# Patient Record
Sex: Female | Born: 1945
Health system: Southern US, Community
[De-identification: ages and names within clinical notes are randomized; demographics above are authoritative.]

## PROBLEM LIST (undated history)

## (undated) DIAGNOSIS — K219 Gastro-esophageal reflux disease without esophagitis: Secondary | ICD-10-CM

## (undated) DIAGNOSIS — M810 Age-related osteoporosis without current pathological fracture: Secondary | ICD-10-CM

## (undated) DIAGNOSIS — K224 Dyskinesia of esophagus: Secondary | ICD-10-CM

## (undated) DIAGNOSIS — F419 Anxiety disorder, unspecified: Secondary | ICD-10-CM

## (undated) DIAGNOSIS — K2970 Gastritis, unspecified, without bleeding: Secondary | ICD-10-CM

## (undated) DIAGNOSIS — T7840XA Allergy, unspecified, initial encounter: Secondary | ICD-10-CM

## (undated) DIAGNOSIS — B9681 Helicobacter pylori [H. pylori] as the cause of diseases classified elsewhere: Secondary | ICD-10-CM

## (undated) DIAGNOSIS — H269 Unspecified cataract: Secondary | ICD-10-CM

## (undated) DIAGNOSIS — L509 Urticaria, unspecified: Secondary | ICD-10-CM

## (undated) DIAGNOSIS — K5790 Diverticulosis of intestine, part unspecified, without perforation or abscess without bleeding: Secondary | ICD-10-CM

## (undated) HISTORY — DX: Diverticulosis of intestine, part unspecified, without perforation or abscess without bleeding: K57.90

## (undated) HISTORY — DX: Urticaria, unspecified: L50.9

## (undated) HISTORY — DX: Age-related osteoporosis without current pathological fracture: M81.0

## (undated) HISTORY — DX: Gastritis, unspecified, without bleeding: K29.70

## (undated) HISTORY — DX: Dyskinesia of esophagus: K22.4

## (undated) HISTORY — PX: COLONOSCOPY: SHX174

## (undated) HISTORY — DX: Unspecified cataract: H26.9

## (undated) HISTORY — DX: Gastro-esophageal reflux disease without esophagitis: K21.9

## (undated) HISTORY — PX: SIGMOIDOSCOPY: SUR1295

## (undated) HISTORY — DX: Anxiety disorder, unspecified: F41.9

## (undated) HISTORY — PX: UPPER GASTROINTESTINAL ENDOSCOPY: SHX188

## (undated) HISTORY — PX: ESOPHAGOGASTRODUODENOSCOPY: SHX1529

## (undated) HISTORY — DX: Helicobacter pylori (H. pylori) as the cause of diseases classified elsewhere: B96.81

## (undated) HISTORY — DX: Allergy, unspecified, initial encounter: T78.40XA

---

## 1998-03-11 ENCOUNTER — Other Ambulatory Visit: Admission: RE | Admit: 1998-03-11 | Discharge: 1998-03-11 | Payer: Self-pay | Admitting: Obstetrics & Gynecology

## 1999-03-24 ENCOUNTER — Other Ambulatory Visit: Admission: RE | Admit: 1999-03-24 | Discharge: 1999-03-24 | Payer: Self-pay | Admitting: Obstetrics & Gynecology

## 2000-05-13 ENCOUNTER — Other Ambulatory Visit: Admission: RE | Admit: 2000-05-13 | Discharge: 2000-05-13 | Payer: Self-pay | Admitting: Family Medicine

## 2000-09-10 ENCOUNTER — Other Ambulatory Visit: Admission: RE | Admit: 2000-09-10 | Discharge: 2000-09-10 | Payer: Self-pay | Admitting: Obstetrics & Gynecology

## 2000-11-15 ENCOUNTER — Other Ambulatory Visit: Admission: RE | Admit: 2000-11-15 | Discharge: 2000-11-15 | Payer: Self-pay | Admitting: Obstetrics & Gynecology

## 2001-05-15 ENCOUNTER — Other Ambulatory Visit: Admission: RE | Admit: 2001-05-15 | Discharge: 2001-05-15 | Payer: Self-pay | Admitting: Obstetrics & Gynecology

## 2001-12-09 ENCOUNTER — Other Ambulatory Visit: Admission: RE | Admit: 2001-12-09 | Discharge: 2001-12-09 | Payer: Self-pay | Admitting: Obstetrics & Gynecology

## 2002-12-28 ENCOUNTER — Other Ambulatory Visit: Admission: RE | Admit: 2002-12-28 | Discharge: 2002-12-28 | Payer: Self-pay | Admitting: Obstetrics & Gynecology

## 2003-09-27 ENCOUNTER — Encounter: Admission: RE | Admit: 2003-09-27 | Discharge: 2003-09-27 | Payer: Self-pay | Admitting: Internal Medicine

## 2003-12-14 ENCOUNTER — Ambulatory Visit: Payer: Self-pay | Admitting: Internal Medicine

## 2004-01-24 ENCOUNTER — Other Ambulatory Visit: Admission: RE | Admit: 2004-01-24 | Discharge: 2004-01-24 | Payer: Self-pay | Admitting: Obstetrics & Gynecology

## 2005-02-21 ENCOUNTER — Other Ambulatory Visit: Admission: RE | Admit: 2005-02-21 | Discharge: 2005-02-21 | Payer: Self-pay | Admitting: Obstetrics & Gynecology

## 2005-04-16 ENCOUNTER — Encounter: Admission: RE | Admit: 2005-04-16 | Discharge: 2005-04-16 | Payer: Self-pay | Admitting: Internal Medicine

## 2005-12-18 ENCOUNTER — Ambulatory Visit: Payer: Self-pay | Admitting: Internal Medicine

## 2005-12-20 ENCOUNTER — Encounter (INDEPENDENT_AMBULATORY_CARE_PROVIDER_SITE_OTHER): Payer: Self-pay | Admitting: *Deleted

## 2005-12-20 ENCOUNTER — Ambulatory Visit: Payer: Self-pay | Admitting: Internal Medicine

## 2006-11-26 ENCOUNTER — Encounter: Admission: RE | Admit: 2006-11-26 | Discharge: 2006-11-26 | Payer: Self-pay | Admitting: Internal Medicine

## 2007-06-26 ENCOUNTER — Telehealth: Payer: Self-pay | Admitting: Internal Medicine

## 2007-07-02 ENCOUNTER — Telehealth: Payer: Self-pay | Admitting: Internal Medicine

## 2007-07-29 DIAGNOSIS — K5730 Diverticulosis of large intestine without perforation or abscess without bleeding: Secondary | ICD-10-CM | POA: Insufficient documentation

## 2007-07-29 DIAGNOSIS — K224 Dyskinesia of esophagus: Secondary | ICD-10-CM | POA: Insufficient documentation

## 2007-07-29 DIAGNOSIS — Z8719 Personal history of other diseases of the digestive system: Secondary | ICD-10-CM | POA: Insufficient documentation

## 2007-07-29 DIAGNOSIS — K219 Gastro-esophageal reflux disease without esophagitis: Secondary | ICD-10-CM | POA: Insufficient documentation

## 2007-07-30 ENCOUNTER — Ambulatory Visit: Payer: Self-pay | Admitting: Internal Medicine

## 2008-10-21 ENCOUNTER — Telehealth: Payer: Self-pay | Admitting: Internal Medicine

## 2009-08-29 ENCOUNTER — Telehealth: Payer: Self-pay | Admitting: Internal Medicine

## 2009-09-01 ENCOUNTER — Encounter: Payer: Self-pay | Admitting: Internal Medicine

## 2009-09-09 ENCOUNTER — Telehealth: Payer: Self-pay | Admitting: Internal Medicine

## 2009-09-22 ENCOUNTER — Ambulatory Visit: Payer: Self-pay | Admitting: Internal Medicine

## 2009-09-22 DIAGNOSIS — R195 Other fecal abnormalities: Secondary | ICD-10-CM | POA: Insufficient documentation

## 2009-09-26 ENCOUNTER — Ambulatory Visit: Payer: Self-pay | Admitting: Internal Medicine

## 2009-10-13 ENCOUNTER — Ambulatory Visit: Payer: Self-pay | Admitting: Internal Medicine

## 2009-10-14 LAB — CONVERTED CEMR LAB
Fecal Occult Blood: NEGATIVE
OCCULT 1: NEGATIVE
OCCULT 4: NEGATIVE
OCCULT 5: NEGATIVE

## 2010-02-16 NOTE — Procedures (Signed)
Summary: Colonoscopy  Patient: [name] Note: All result statuses are Final unless otherwise noted.  Tests: (1) Colonoscopy (COL)   COL Colonoscopy           DONE     [facility]     [address]     [place], [place]  [address]           COLONOSCOPY PROCEDURE REPORT           PATIENT:  Nicole Mercer, Nicole Mercer  MR#:  [id number]     BIRTHDATE:  14-Dec-1945, 63 yrs. old  GENDER:  female     ENDOSCOPIST:  Hedwig Morton. Juanda Chance, MD     REF. BY:  Varney Baas, M.D.     PROCEDURE DATE:  09/26/2009     PROCEDURE:  Colonoscopy 19147     ASA CLASS:  Class I     INDICATIONS:  heme positive stool last colon 2 colonoscopies 2000     and 2007, mild divertic and small hems           hx of GERD and treated H.Pylori     MEDICATIONS:   Versed 10 mg, Fentanyl 75 mcg           DESCRIPTION OF PROCEDURE:   After the risks benefits and     alternatives of the procedure were thoroughly explained, informed     consent was obtained.  Digital rectal exam was performed and     revealed no rectal masses.   The LB [id number]-[id number] endoscope     was introduced through the anus and advanced to the cecum, which     was identified by both the appendix and ileocecal valve, without     limitations.  The quality of the prep was good, using MiraLax.     The instrument was then slowly withdrawn as the colon was fully     examined.     <<PROCEDUREIMAGES>>           FINDINGS:  Mild diverticulosis was found (see image4). minimal     sigmoid diverticulosis  normal rectum (see image5 and image6). no     significant internal hemorrhoids,  This was otherwise a normal     examination of the colon (see image3, image2, and image1).     Retroflexion was not performed.  The scope was then withdrawn from     the patient and the procedure completed.           COMPLICATIONS:  None     ENDOSCOPIC IMPRESSION:     1) Mild diverticulosis     2) Normal rectum     3) Otherwise normal examination     4) Normal colonoscopy  nothing to account for the heme positive stool on the home test,     will repeat hemoccults     RECOMMENDATIONS:     repeat hemoccult cards     REPEAT EXAM:  In 10 year(s) for.           ______________________________     Hedwig Morton. Juanda Chance, MD           CC:  R. Robley Fries, MD           n.     Rosalie DoctorHedwig Morton. Lukka Black at 09/26/2009 09:59 AM           Ellwood Sayers, [phone]  Note: An exclamation mark (!) indicates a result that was not dispersed into the flowsheet. Document Creation Date: 09/26/2009  10:03 AM _______________________________________________________________________  (1) Order result status: Final Collection or observation date-time: 09/26/2009 09:50 Requested date-time:  Receipt date-time:  Reported date-time:  Referring Physician:   Ordering Physician: [name] [phone]) Specimen Source:  Source: Launa Grill Order Number: [id number] Lab site:   Appended Document: Colonoscopy    Clinical Lists Changes  Observations: Added new observation of COLONNXTDUE: 09/2019 (09/26/2009 11:08)

## 2010-02-16 NOTE — Progress Notes (Signed)
Summary: Talk to nurse   Phone Note Call from Patient Call back at [phone]   Caller: Patient Call For: Dr. Juanda Chance Reason for Call: Talk to Nurse Summary of Call: Pt doing [facility] (?) test given to her by her GYN... her 1st test was positive, turned blue.  She has a reg appt w/Dr. Juanda Chance on 09/22/09, wants to know if there is any other lab test she needs to do prior to this appt.  Please let her know  [phone]. Initial call taken by: Misty Stanley,  September 09, 2009 9:49 AM  Follow-up for Phone Call        Patient states that she had positive hemocult. She also had a blood count with her GYN which she will get faxed over to [place]. It was apparently normal. I have advised her that should we need additional testing, we will do it on the day of her visit after we discuss any symptoms etc. Patient verbalizes understanding. Follow-up by: Lamona Curl CMA Duncan Dull),  September 09, 2009 10:18 AM

## 2010-02-16 NOTE — Assessment & Plan Note (Signed)
Summary: f/u--ch.    History of Present Illness Visit Type: Follow-up Visit Primary GI MD: [name] MD Primary Provider: Iran Planas, MD Chief Complaint: Hem positive stool cards from Dr. Neal.Patient still has abdominal pain & feels as if she has not completely evacuated History of Present Illness:   This is a 65 year old white female with heme positive stool on a home test given by Dr Konrad Dolores. She denies any symptoms of abdominal pain or rectal bleeding. Her last colonoscopy in December 2007 was essentially normal with mild diverticulosis of the left colon. She has a family history of colon polyps in her mother. A prior colonoscopy in 2000 was done because of heme positive stool but no significant lesion was found. She is known to have small internal hemorrhoids from the delivery of her 3 children. We have seen her for gastroesophageal reflux and dyspepsia related to H. pylori infection in 1996 which was treated and again retreated in 2004.   GI Review of Systems    Reports abdominal pain and  bloating.     Location of  Abdominal pain: epigastric area.    Denies acid reflux, belching, chest pain, dysphagia with liquids, dysphagia with solids, heartburn, loss of appetite, nausea, vomiting, vomiting blood, weight loss, and  weight gain.      Reports constipation and  heme positive stool.     Denies anal fissure, black tarry stools, change in bowel habit, diarrhea, diverticulosis, fecal incontinence, hemorrhoids, irritable bowel syndrome, jaundice, light color stool, liver problems, rectal bleeding, and  rectal pain.    Current Medications (verified): 1)  Prilosec 20 Mg Cpdr (Omeprazole) .... Take 1 Tablet By Mouth Once A Day. 2)  Zoloft 100 Mg  Tabs (Sertraline Hcl) .... Once Daily 3)  Estradiol   Powd (Estradiol) .... Take 1mg  Daily 4)  Calcium 500/vitamin D 500-125 Mg-Unit  Tabs (Calcium Carbonate-Vitamin D) .... Once Daily 5)  Prometrium 200 Mg  Caps (Progesterone Micronized) ....  Take 12 Days Q36mo 6)  Multivitamins  Tabs (Multiple Vitamin) .... Once Daily 7)  B Complex 100  Tabs (B Complex Vitamins) .... Once Daily 8)  Clonazepam 1 Mg Tabs (Clonazepam) .... At Bedtime  Allergies (verified): No Known Drug Allergies  Past History:  Past Medical History: Reviewed history from 07/30/2007 and no changes required. Current Problems:  DIVERTICULOSIS OF COLON (ICD-562.10) HELICOBACTER PYLORI GASTRITIS, HX OF (ICD-V12.79) ESOPHAGEAL SPASM (ICD-530.5) GERD (ICD-530.81)  Family History: Reviewed history from 07/30/2007 and no changes required. Family History of Prostate Cancer:Father Family History of Colon Polyps: Mother Family History of Diabetes: Mother & Sister  Social History: Reviewed history from 07/30/2007 and no changes required. Occupation: Health visitor Patient currently smokes.  Illicit Drug Use - no Daily Caffeine Use-1-2 cups daily Patient gets regular exercise.  Review of Systems       The patient complains of arthritis/joint pain, change in vision, fatigue, and sleeping problems.  The patient denies allergy/sinus, anemia, anxiety-new, back pain, blood in urine, breast changes/lumps, confusion, cough, coughing up blood, depression-new, fainting, fever, headaches-new, hearing problems, heart murmur, heart rhythm changes, itching, menstrual pain, muscle pains/cramps, night sweats, nosebleeds, pregnancy symptoms, shortness of breath, skin rash, sore throat, swelling of feet/legs, swollen lymph glands, thirst - excessive, urination - excessive, urination changes/pain, urine leakage, vision changes, and voice change.         Pertinent positive and negative review of systems were noted in the above HPI. All other ROS was otherwise negative.   Vital Signs:  Patient profile:  65 year old female Height:      66 inches Weight:      141.13 pounds BMI:     22.86 Pulse rate:   68 / minute Pulse rhythm:   regular BP sitting:   88 / 60  (left arm) Cuff  size:   regular  Vitals Entered By: June McMurray CMA Duncan Dull) (September 22, 2009 8:13 AM)  Physical Exam  General:  Well developed, well nourished, no acute distress. Mouth:  No deformity or lesions, dentition normal. Neck:  Supple; no masses or thyromegaly. Lungs:  Clear throughout to auscultation. Heart:  Regular rate and rhythm; no murmurs, rubs,  or bruits. Abdomen:  Soft, nontender and nondistended. No masses, hepatosplenomegaly or hernias noted. Normal bowel sounds. Rectal:  rectal and anoscopic exam reveals external hemorrhoidal tags which appeared to be irritated. Normal rectal sphincter tone, normal mucosa of the rectal ampulla with two small bluish hemorrhoids; internal and external. There were no signs of bleeding. She was Hemoccult negative. Extremities:  No clubbing, cyanosis, edema or deformities noted. Skin:  Intact without significant lesions or rashes. Psych:  Alert and cooperative. Normal mood and affect.   Impression & Recommendations:  Problem # 1:  NONSPECIFIC ABNORMAL FINDING IN STOOL CONTENTS (ICD-792.1) Patient had Hemoccult-positive stool on a home test. This also  occurred  in 2000. On today's exam, there is no evidence of an anorectal source of bleeding. Her last colonoscopy was 4 years ago. We will plan another colonoscopy to rule out colon polyps or colon cancer.  Orders: Colonoscopy (Colon)  Problem # 2:  HELICOBACTER PYLORI GASTRITIS, HX OF (ICD-V12.79) Patient was treated in 1996 and again in 2004. Currently, she has no symptoms.  Patient Instructions: 1)  Schedule colonoscopy, MiraLax prep. Discussed prep sedation and the procedure the patient. 2)  Copy sent to : Dr Konrad Dolores, Dr Johnella Moloney 3)  The medication list was reviewed and reconciled.  All changed / newly prescribed medications were explained.  A complete medication list was provided to the patient / caregiver. Prescriptions: DULCOLAX 5 MG  TBEC (BISACODYL) Day before procedure take 2 at  3pm and 2 at 8pm.  #4 x 0   Entered by:   Lamona Curl CMA (AAMA)   Authorized by:   Hart Carwin MD   Signed by:   Lamona Curl CMA (AAMA) on 09/22/2009   Method used:   [facility]        [facility] [address] [phone]* (retail)       [address]       [place], [place]  [address]       Ph: [phone] or [phone]       Fax: [phone]   RxID:   [id number] REGLAN 10 MG  TABS (METOCLOPRAMIDE HCL) As per prep instructions.  #2 x 0   Entered by:   Lamona Curl CMA (AAMA)   Authorized by:   Hart Carwin MD   Signed by:   Lamona Curl CMA (AAMA) on 09/22/2009   Method used:   [facility]        [facility] [address] [phone]* (retail)       [address]       [place], [place]  [address]       Ph: [phone] or [phone]       Fax: [phone]   RxID:   [id number] [facility]  POWD (POLYETHYLENE GLYCOL 3350) As per prep  instructions.  #255gm x 0   Entered by:   Lamona Curl CMA (AAMA)   Authorized by:   Hart Carwin MD   Signed by:   Lamona Curl CMA (AAMA) on 09/22/2009   Method used:   [facility]        [facility] [address] [phone]* (retail)       [address]       [place], [place]  [address]       Ph: [phone] or [phone]       Fax: [phone]   RxID:   [id number]

## 2010-02-16 NOTE — Progress Notes (Signed)
Summary: Medication  Medications Added PRILOSEC 20 MG CPDR (OMEPRAZOLE) Take 1 tablet by mouth once a day.       Phone Note Call from Patient   Caller: Patient Call For: Dr. Juanda Chance Reason for Call: Refill Medication Summary of Call: Needs a refill on her Omeprazole.....sch'd appt. for 09-22-09 Initial call taken by: Karna Christmas,  August 29, 2009 9:25 AM  Follow-up for Phone Call        Prescription sent until her 09/22/09 appointment. Follow-up by: Lamona Curl CMA Duncan Dull),  August 29, 2009 9:41 AM    New/Updated Medications: PRILOSEC 20 MG CPDR (OMEPRAZOLE) Take 1 tablet by mouth once a day. Prescriptions: PRILOSEC 20 MG CPDR (OMEPRAZOLE) Take 1 tablet by mouth once a day.  #30 x 0   Entered by:   Lamona Curl CMA (AAMA)   Authorized by:   Hart Carwin MD   Signed by:   Lamona Curl CMA (AAMA) on 08/29/2009   Method used:   [facility]        [facility] [address] [phone]* (retail)       [address]       [place], [place]  [address]       Ph: [phone] or [phone]       Fax: [phone]   RxID:   [id number]

## 2010-02-16 NOTE — Letter (Signed)
Summary: [facility] Instructions  [facility]  [address] [place], [place] [address]   Phone: [phone]  Fax: [phone]       Nicole Mercer    [birth year]    MRN: [id number]       Procedure Day /Date: 09/26/09 Monday     Arrival Time: 8:30 am     Procedure Time: 9:30 am     Location of Procedure:                    _x _  [facility] (4th Floor)  PREPARATION FOR COLONOSCOPY WITH MIRALAX  Starting 5 days prior to your procedure (09/22/09) do not eat nuts, seeds, popcorn, corn, beans, peas,  salads, or any raw vegetables.  Do not take any fiber supplements (e.g. Metamucil, Citrucel, and Benefiber). ____________________________________________________________________________________________________   THE DAY BEFORE YOUR PROCEDURE         DATE: 09/25/09 DAY: Sunday  1   Drink clear liquids the entire day-NO SOLID FOOD  2   Do not drink anything colored red or purple.  Avoid juices with pulp.  No orange juice.  3   Drink at least 64 oz. (8 glasses) of fluid/clear liquids during the day to prevent dehydration and help the prep work efficiently.  CLEAR LIQUIDS INCLUDE: Water Jello Ice Popsicles Tea (sugar ok, no milk/cream) Powdered fruit flavored drinks Coffee (sugar ok, no milk/cream) Gatorade Juice: apple, white grape, white cranberry  Lemonade Clear bullion, consomm, broth Carbonated beverages (any kind) Strained chicken noodle soup Hard Candy  4   Mix the entire bottle of Miralax with 64 oz. of Gatorade/Powerade in the morning and put in the refrigerator to chill.  5   At 3:00 pm take 2 Dulcolax/Bisacodyl tablets.  6   At 4:30 pm take one Reglan/Metoclopramide tablet.  7  Starting at 5:00 pm drink one 8 oz glass of the Miralax mixture every 15-20 minutes until you have finished drinking the entire 64 oz.  You should finish drinking prep around 7:30 or 8:00 pm.  8   If you are nauseated, you may take the 2nd Reglan/Metoclopramide tablet at  6:30 pm.        9    At 8:00 pm take 2 more DULCOLAX/Bisacodyl tablets.        THE DAY OF YOUR PROCEDURE      DATE:  09/26/09 DAY: Sunday  You may drink clear liquids until 7:30 am (2 HOURS BEFORE PROCEDURE).   MEDICATION INSTRUCTIONS  Unless otherwise instructed, you should take regular prescription medications with a small sip of water as early as possible the morning of your procedure.       OTHER INSTRUCTIONS  You will need a responsible adult at least 64 years of age to accompany you and drive you home.   This person must remain in the waiting room during your procedure.  Wear loose fitting clothing that is easily removed.  Leave jewelry and other valuables at home.  However, you may wish to bring a book to read or an iPod/MP3 player to listen to music as you wait for your procedure to start.  Remove all body piercing jewelry and leave at home.  Total time from sign-in until discharge is approximately 2-3 hours.  You should go home directly after your procedure and rest.  You can resume normal activities the day after your procedure.  The day of your procedure you should not:   Drive   Make legal decisions  Operate machinery   Drink alcohol   Return to work  You will receive specific instructions about eating, activities and medications before you leave.   The above instructions have been reviewed and explained to me by   Lamona Curl CMA Duncan Dull)  September 22, 2009 8:45 AM     I fully understand and can verbalize these instructions _____________________________ Date 09/22/09

## 2010-06-02 NOTE — Assessment & Plan Note (Signed)
[facility]                         GASTROENTEROLOGY OFFICE NOTE   Nicole, Mercer                        MRN:          [id number]  DATE:12/18/2005                            DOB:          1945/12/29    Nicole Mercer is a very nice 65 year old white female who we have followed  for gastroesophageal reflux disease, esophageal spasm and positive  family history of colon polyps in her mother. She had a colonoscopy in  1990 which was normal and most recent colonoscopy in June 2000 that  showed mild diverticulosis of the left colon. She has a history of H.  pylori gastritis found on upper endoscopy in August 1996. She was  treated with triple therapy and had positive H. pylori breath test in  2004. We saw her last time in 2005 with three episodes of severe  subxiphoid pain radiating around to the back and to the shoulder blade.  Ultrasound of the gallbladder at that time was negative. The diagnosis  was not clear but differential diagnosis included esophageal spasm  versus gallbladder attack. She has recently had 2 additional attacks  which each lasted about 1 or 2 hours, occurred during the day, not  necessarily related to meals. The patient is a smoker. She smokes about  1/2-pack of cigarettes a day. She drinks only 1 or 2 cups of coffee a  day and takes occasional ibuprofen. She denies excessive stress. Her  weight has been stable, slightly increased by a few pounds. After having  the second episode of epigastric pain, the patient started taking her  Protonix 40 mg a day which she was put on in the past but has not  continued to take it.   MEDICATIONS:  1. Zoloft 100 mg p.o. daily.  2. Prempro.  3. Clonazepam q.h.s.  4. Protonix 40 mg p.o. daily.  5. Calcium supplements.  6. Multivitamins.   PHYSICAL EXAMINATION:  VITAL SIGNS:  Blood pressure 98/78, pulse 65.  GENERAL:  She was alert and oriented in no distress.  LUNGS:  Clear to auscultation.  COR:  Normal S1, normal S2.  ABDOMEN:  Soft, nontender with normal active bowel sounds. I could not  elicit any tenderness in the epigastrium or along the right upper  quadrant. There was no bruit. The liver edge was at the costal margin,  lower abdomen was normal.  RECTAL:  Not done.  EXTREMITIES:  No edema.   IMPRESSION:  72. A 65 year old white female with recurrent subxiphoid/epigastric      abdominal pain which resembles esophageal spasm. It has been      occurring off and on for several years and does not show any      progression in terms of duration or frequency of the attack. It is      more consistent with esophageal spasm because of short duration of      only 1 or 2 hours at most. After the attack subsides, she is      usually well. The attacks have not recurred since she has been on      Protonix 40 mg  a day. Her acid reflux may be precipitated by      smoking or she might have developed a hiatal hernia or recurrent H.      pylori gastropathy. Gallbladder is still a possibility but today's      exam does not show any tenderness in the right upper quadrant.  2. The patient is in need of a repeat colonoscopy because of family      history of colon polyps and personal history of diverticulosis.   PLAN:  1. Prescription for Protonix 40 mg on daily basis.  2. Patient encouraged to stop smoking.  3. Take Tums, Rolaids or Vicodin p.r.n. chest pain if attack occurs.  4. Schedule colonoscopy as well as upper endoscopy, planning full      biopsies for H. pylori.     Nicole Mercer. Juanda Chance, MD  Electronically Signed    DMB/MedQ  DD: 12/18/2005  DT: 12/18/2005  Job #: [id number]   cc:   Candyce Churn, M.D.

## 2010-07-21 ENCOUNTER — Other Ambulatory Visit: Payer: Self-pay | Admitting: Internal Medicine

## 2010-12-14 ENCOUNTER — Other Ambulatory Visit: Payer: Self-pay | Admitting: Internal Medicine

## 2011-04-11 ENCOUNTER — Other Ambulatory Visit: Payer: Self-pay | Admitting: *Deleted

## 2011-04-11 MED ORDER — OMEPRAZOLE 20 MG PO CPDR: 20.0000 mg | DELAYED_RELEASE_CAPSULE | Freq: Every day | ORAL | Status: DC

## 2011-08-11 ENCOUNTER — Other Ambulatory Visit: Payer: Self-pay | Admitting: Internal Medicine

## 2011-08-13 ENCOUNTER — Other Ambulatory Visit: Payer: Self-pay | Admitting: Internal Medicine

## 2011-08-13 MED ORDER — OMEPRAZOLE 20 MG PO CPDR: 20.0000 mg | DELAYED_RELEASE_CAPSULE | Freq: Every day | ORAL | Status: DC

## 2011-08-13 NOTE — Telephone Encounter (Signed)
Rx sent 

## 2011-09-03 ENCOUNTER — Encounter: Payer: Self-pay | Admitting: *Deleted

## 2011-09-21 ENCOUNTER — Encounter: Payer: Self-pay | Admitting: Internal Medicine

## 2011-09-21 ENCOUNTER — Ambulatory Visit (INDEPENDENT_AMBULATORY_CARE_PROVIDER_SITE_OTHER): Payer: Federal, State, Local not specified - PPO | Admitting: Internal Medicine

## 2011-09-21 VITALS — BP 98/60 | HR 60 | Ht 66.0 in | Wt 145.0 lb

## 2011-09-21 DIAGNOSIS — K219 Gastro-esophageal reflux disease without esophagitis: Secondary | ICD-10-CM

## 2011-09-21 DIAGNOSIS — R0989 Other specified symptoms and signs involving the circulatory and respiratory systems: Secondary | ICD-10-CM

## 2011-09-21 MED ORDER — OMEPRAZOLE 20 MG PO CPDR: 20.0000 mg | DELAYED_RELEASE_CAPSULE | Freq: Every day | ORAL | Status: DC

## 2011-09-21 NOTE — Progress Notes (Signed)
Nicole Mercer Oct 17, 1945 MRN [id number]        History of Present Illness:  This is a 66 year old white female with history of H. pylori gastritis on upper endoscopy in December 2007. She was treated and has been on long-term Prilosec 20 mg once a day. She doesn't notice any difference when she doesn't take it. Denies any heartburn abdominal pain. Colonoscopy in 2000, 2007 and in September 2011 showed mild diverticulosis. She has a family history of colon polyps. Her Hemoccult-positive stool was due to hematuria. Upper abdominal ultrasound in September 2005 was normal   Past Medical History  Diagnosis Date  . Diverticulosis   . Helicobacter pylori gastritis   . Esophageal spasm   . GERD (gastroesophageal reflux disease)    No past surgical history on file.  reports that she has quit smoking. She has never used smokeless tobacco. She reports that she drinks alcohol. She reports that she does not use illicit drugs. family history includes Colon polyps in her mother; Diabetes in her mother and sister; and Prostate cancer in her father.  There is no history of Colon cancer. No Known Allergies      Review of Systems: Negative for dysphagia heartburn or odynophagia  The remainder of the 10 point ROS is negative except as outlined in H&P   Physical Exam: General appearance  Well developed, in no distress. Eyes- non icteric. HEENT nontraumatic, normocephalic. Mouth no lesions, tongue papillated, no cheilosis. Neck supple without adenopathy, thyroid not enlarged, no carotid bruits, no JVD. Lungs Clear to auscultation bilaterally. Cor normal S1, normal S2, regular rhythm, no murmur,  quiet precordium. Abdomen: Soft nontender abdomen with the abdominal aortic bruit in epigastrium. Palpable aortic pulsations. No other abnormality. Rectal: Soft Hemoccult negative stool Extremities no pedal edema. Skin no lesions. Neurological alert and oriented x 3. Psychological normal mood and  affect.  Assessment and Plan:  Patient with history of fall H. pyloric gastritis now completely asymptomatic. I don't see any reason for long term proton pump inhibitors. I have asked her to stop taking Prilosec on regular basis and use it on a when necessary.  Abdominal aortic bruit. We will obtain upper abdominal ultrasound to determine the size of the abdominal aorta  Colorectal screening. Last colonoscopy September 2011. Recall colonoscopy planned for September 2021   09/21/2011 Nicole Mercer

## 2011-09-21 NOTE — Patient Instructions (Addendum)
We have sent the following medications to your pharmacy for you to pick up at your convenience: Omeprazole You have been scheduled for an abdominal ultrasound at [facility] (1st floor of hospital) on Wednesday 09/26/11 at 8:00 am. Please arrive 15 minutes prior to your appointment for registration. Make certain not to have anything to eat or drink 6 hours prior to your appointment. Should you need to reschedule your appointment, please contact radiology at [phone]. CC: Dr Marden Noble

## 2011-09-22 ENCOUNTER — Encounter: Payer: Self-pay | Admitting: Internal Medicine

## 2011-09-26 ENCOUNTER — Ambulatory Visit (HOSPITAL_COMMUNITY)
Admission: RE | Admit: 2011-09-26 | Discharge: 2011-09-26 | Disposition: A | Discharge status: Home / Self Care | Payer: Federal, State, Local not specified - PPO | Source: Ambulatory Visit | Attending: Internal Medicine | Admitting: Internal Medicine

## 2011-09-26 DIAGNOSIS — I700 Atherosclerosis of aorta: Secondary | ICD-10-CM | POA: Insufficient documentation

## 2011-09-26 DIAGNOSIS — R0989 Other specified symptoms and signs involving the circulatory and respiratory systems: Secondary | ICD-10-CM | POA: Insufficient documentation

## 2012-01-21 DIAGNOSIS — M169 Osteoarthritis of hip, unspecified: Secondary | ICD-10-CM | POA: Diagnosis not present

## 2012-01-21 DIAGNOSIS — M1610 Unilateral primary osteoarthritis, unspecified hip: Secondary | ICD-10-CM | POA: Diagnosis not present

## 2012-03-02 ENCOUNTER — Other Ambulatory Visit: Payer: Self-pay | Admitting: Internal Medicine

## 2012-03-18 ENCOUNTER — Other Ambulatory Visit: Payer: Self-pay | Admitting: Internal Medicine

## 2012-03-20 ENCOUNTER — Telehealth: Payer: Self-pay | Admitting: Internal Medicine

## 2012-03-20 NOTE — Telephone Encounter (Signed)
Advised patient that per pharmacy, a script is on hold for omeprazole. I have asked that they fill this script. Patient advised and states that pharmacy told her that they have not gotten any response from [place]. I advised that I have denied script with indication that patient should have another script for 3 more month supply so not sure what happened. Patient verbalizes understanding and has been advised that in the future, she should contact me directly if she has waited for more than 2 days for a prescription to be filled as chances are, I have not received any correspondence or there is a problem with the medication refill. She verbalizes understanding of this as well.

## 2012-03-26 DIAGNOSIS — H35379 Puckering of macula, unspecified eye: Secondary | ICD-10-CM | POA: Diagnosis not present

## 2012-09-22 DIAGNOSIS — L821 Other seborrheic keratosis: Secondary | ICD-10-CM | POA: Diagnosis not present

## 2012-09-22 DIAGNOSIS — D1801 Hemangioma of skin and subcutaneous tissue: Secondary | ICD-10-CM | POA: Diagnosis not present

## 2012-09-22 DIAGNOSIS — L259 Unspecified contact dermatitis, unspecified cause: Secondary | ICD-10-CM | POA: Diagnosis not present

## 2012-09-22 DIAGNOSIS — L738 Other specified follicular disorders: Secondary | ICD-10-CM | POA: Diagnosis not present

## 2013-05-01 DIAGNOSIS — J209 Acute bronchitis, unspecified: Secondary | ICD-10-CM | POA: Diagnosis not present

## 2013-05-01 DIAGNOSIS — J019 Acute sinusitis, unspecified: Secondary | ICD-10-CM | POA: Diagnosis not present

## 2013-06-15 ENCOUNTER — Other Ambulatory Visit: Payer: Self-pay | Admitting: Internal Medicine

## 2013-06-15 ENCOUNTER — Ambulatory Visit
Admission: RE | Admit: 2013-06-15 | Discharge: 2013-06-15 | Disposition: A | Discharge status: Home / Self Care | Payer: Medicare Other | Source: Ambulatory Visit | Attending: Internal Medicine | Admitting: Internal Medicine

## 2013-06-15 DIAGNOSIS — Z87891 Personal history of nicotine dependence: Secondary | ICD-10-CM | POA: Diagnosis not present

## 2013-06-15 DIAGNOSIS — J984 Other disorders of lung: Secondary | ICD-10-CM | POA: Diagnosis not present

## 2013-06-15 DIAGNOSIS — R05 Cough: Secondary | ICD-10-CM

## 2013-06-15 DIAGNOSIS — K5730 Diverticulosis of large intestine without perforation or abscess without bleeding: Secondary | ICD-10-CM | POA: Diagnosis not present

## 2013-06-15 DIAGNOSIS — J309 Allergic rhinitis, unspecified: Secondary | ICD-10-CM | POA: Diagnosis not present

## 2013-06-15 DIAGNOSIS — J4489 Other specified chronic obstructive pulmonary disease: Secondary | ICD-10-CM | POA: Diagnosis not present

## 2013-06-15 DIAGNOSIS — R059 Cough, unspecified: Secondary | ICD-10-CM | POA: Diagnosis not present

## 2013-06-15 DIAGNOSIS — J449 Chronic obstructive pulmonary disease, unspecified: Secondary | ICD-10-CM | POA: Diagnosis not present

## 2013-06-15 DIAGNOSIS — Z1331 Encounter for screening for depression: Secondary | ICD-10-CM | POA: Diagnosis not present

## 2013-06-15 DIAGNOSIS — J321 Chronic frontal sinusitis: Secondary | ICD-10-CM | POA: Diagnosis not present

## 2013-06-15 DIAGNOSIS — Z23 Encounter for immunization: Secondary | ICD-10-CM | POA: Diagnosis not present

## 2013-06-15 DIAGNOSIS — Z0000 Encounter for general adult medical examination without abnormal findings: Secondary | ICD-10-CM | POA: Diagnosis not present

## 2013-06-15 DIAGNOSIS — Z136 Encounter for screening for cardiovascular disorders: Secondary | ICD-10-CM | POA: Diagnosis not present

## 2013-07-20 ENCOUNTER — Ambulatory Visit
Admission: RE | Admit: 2013-07-20 | Discharge: 2013-07-20 | Disposition: A | Discharge status: Home / Self Care | Payer: Medicare Other | Source: Ambulatory Visit | Attending: Internal Medicine | Admitting: Internal Medicine

## 2013-07-20 ENCOUNTER — Other Ambulatory Visit: Payer: Self-pay | Admitting: Internal Medicine

## 2013-07-20 DIAGNOSIS — R9389 Abnormal findings on diagnostic imaging of other specified body structures: Secondary | ICD-10-CM | POA: Diagnosis not present

## 2013-07-20 DIAGNOSIS — J449 Chronic obstructive pulmonary disease, unspecified: Secondary | ICD-10-CM | POA: Diagnosis not present

## 2013-07-20 DIAGNOSIS — J321 Chronic frontal sinusitis: Secondary | ICD-10-CM | POA: Diagnosis not present

## 2013-07-20 DIAGNOSIS — Z87891 Personal history of nicotine dependence: Secondary | ICD-10-CM

## 2013-07-23 ENCOUNTER — Other Ambulatory Visit: Payer: Self-pay | Admitting: Internal Medicine

## 2013-07-23 DIAGNOSIS — R9389 Abnormal findings on diagnostic imaging of other specified body structures: Secondary | ICD-10-CM

## 2013-07-27 DIAGNOSIS — Z1212 Encounter for screening for malignant neoplasm of rectum: Secondary | ICD-10-CM | POA: Diagnosis not present

## 2013-07-27 DIAGNOSIS — N390 Urinary tract infection, site not specified: Secondary | ICD-10-CM | POA: Diagnosis not present

## 2013-07-27 DIAGNOSIS — Z130 Encounter for screening for diseases of the blood and blood-forming organs and certain disorders involving the immune mechanism: Secondary | ICD-10-CM | POA: Diagnosis not present

## 2013-07-27 DIAGNOSIS — Z1231 Encounter for screening mammogram for malignant neoplasm of breast: Secondary | ICD-10-CM | POA: Diagnosis not present

## 2013-07-27 DIAGNOSIS — Z124 Encounter for screening for malignant neoplasm of cervix: Secondary | ICD-10-CM | POA: Diagnosis not present

## 2013-07-29 ENCOUNTER — Ambulatory Visit
Admission: RE | Admit: 2013-07-29 | Discharge: 2013-07-29 | Disposition: A | Discharge status: Home / Self Care | Payer: Medicare Other | Source: Ambulatory Visit | Attending: Internal Medicine | Admitting: Internal Medicine

## 2013-07-29 DIAGNOSIS — R9389 Abnormal findings on diagnostic imaging of other specified body structures: Secondary | ICD-10-CM

## 2013-07-29 DIAGNOSIS — R911 Solitary pulmonary nodule: Secondary | ICD-10-CM | POA: Diagnosis not present

## 2013-10-21 DIAGNOSIS — M5442 Lumbago with sciatica, left side: Secondary | ICD-10-CM | POA: Diagnosis not present

## 2013-10-21 DIAGNOSIS — M25552 Pain in left hip: Secondary | ICD-10-CM | POA: Diagnosis not present

## 2013-10-21 DIAGNOSIS — M7062 Trochanteric bursitis, left hip: Secondary | ICD-10-CM | POA: Diagnosis not present

## 2013-10-27 DIAGNOSIS — M25552 Pain in left hip: Secondary | ICD-10-CM | POA: Diagnosis not present

## 2013-10-29 DIAGNOSIS — M25552 Pain in left hip: Secondary | ICD-10-CM | POA: Diagnosis not present

## 2013-11-07 DIAGNOSIS — Z23 Encounter for immunization: Secondary | ICD-10-CM | POA: Diagnosis not present

## 2013-11-09 DIAGNOSIS — M25552 Pain in left hip: Secondary | ICD-10-CM | POA: Diagnosis not present

## 2013-11-13 DIAGNOSIS — M25552 Pain in left hip: Secondary | ICD-10-CM | POA: Diagnosis not present

## 2013-11-17 DIAGNOSIS — M25552 Pain in left hip: Secondary | ICD-10-CM | POA: Diagnosis not present

## 2013-11-19 DIAGNOSIS — M25552 Pain in left hip: Secondary | ICD-10-CM | POA: Diagnosis not present

## 2013-11-24 DIAGNOSIS — M25552 Pain in left hip: Secondary | ICD-10-CM | POA: Diagnosis not present

## 2013-11-26 DIAGNOSIS — M7062 Trochanteric bursitis, left hip: Secondary | ICD-10-CM | POA: Diagnosis not present

## 2013-11-26 DIAGNOSIS — M25552 Pain in left hip: Secondary | ICD-10-CM | POA: Diagnosis not present

## 2013-11-26 DIAGNOSIS — M1611 Unilateral primary osteoarthritis, right hip: Secondary | ICD-10-CM | POA: Diagnosis not present

## 2013-11-27 DIAGNOSIS — M25552 Pain in left hip: Secondary | ICD-10-CM | POA: Diagnosis not present

## 2014-03-16 DIAGNOSIS — M791 Myalgia: Secondary | ICD-10-CM | POA: Diagnosis not present

## 2014-03-19 DIAGNOSIS — M25552 Pain in left hip: Secondary | ICD-10-CM | POA: Diagnosis not present

## 2014-04-21 DIAGNOSIS — S3210XD Unspecified fracture of sacrum, subsequent encounter for fracture with routine healing: Secondary | ICD-10-CM | POA: Diagnosis not present

## 2014-04-21 DIAGNOSIS — M791 Myalgia: Secondary | ICD-10-CM | POA: Diagnosis not present

## 2014-08-02 ENCOUNTER — Other Ambulatory Visit: Payer: Self-pay | Admitting: Obstetrics & Gynecology

## 2014-08-02 DIAGNOSIS — Z1231 Encounter for screening mammogram for malignant neoplasm of breast: Secondary | ICD-10-CM | POA: Diagnosis not present

## 2014-08-02 DIAGNOSIS — Z6821 Body mass index (BMI) 21.0-21.9, adult: Secondary | ICD-10-CM | POA: Diagnosis not present

## 2014-08-02 DIAGNOSIS — Z01419 Encounter for gynecological examination (general) (routine) without abnormal findings: Secondary | ICD-10-CM | POA: Diagnosis not present

## 2014-08-02 DIAGNOSIS — Z124 Encounter for screening for malignant neoplasm of cervix: Secondary | ICD-10-CM | POA: Diagnosis not present

## 2014-08-03 LAB — CYTOLOGY - PAP

## 2014-08-10 DIAGNOSIS — S3210XA Unspecified fracture of sacrum, initial encounter for closed fracture: Secondary | ICD-10-CM | POA: Diagnosis not present

## 2014-08-10 DIAGNOSIS — Z136 Encounter for screening for cardiovascular disorders: Secondary | ICD-10-CM | POA: Diagnosis not present

## 2014-08-10 DIAGNOSIS — K219 Gastro-esophageal reflux disease without esophagitis: Secondary | ICD-10-CM | POA: Diagnosis not present

## 2014-08-10 DIAGNOSIS — K5730 Diverticulosis of large intestine without perforation or abscess without bleeding: Secondary | ICD-10-CM | POA: Diagnosis not present

## 2014-08-10 DIAGNOSIS — Z79899 Other long term (current) drug therapy: Secondary | ICD-10-CM | POA: Diagnosis not present

## 2014-08-10 DIAGNOSIS — M5432 Sciatica, left side: Secondary | ICD-10-CM | POA: Diagnosis not present

## 2014-08-10 DIAGNOSIS — J449 Chronic obstructive pulmonary disease, unspecified: Secondary | ICD-10-CM | POA: Diagnosis not present

## 2014-08-10 DIAGNOSIS — F17201 Nicotine dependence, unspecified, in remission: Secondary | ICD-10-CM | POA: Diagnosis not present

## 2014-08-10 DIAGNOSIS — R002 Palpitations: Secondary | ICD-10-CM | POA: Diagnosis not present

## 2014-08-10 DIAGNOSIS — Z0001 Encounter for general adult medical examination with abnormal findings: Secondary | ICD-10-CM | POA: Diagnosis not present

## 2014-08-10 DIAGNOSIS — J309 Allergic rhinitis, unspecified: Secondary | ICD-10-CM | POA: Diagnosis not present

## 2014-08-10 DIAGNOSIS — G4700 Insomnia, unspecified: Secondary | ICD-10-CM | POA: Diagnosis not present

## 2014-08-11 ENCOUNTER — Other Ambulatory Visit: Payer: Self-pay | Admitting: Acute Care

## 2014-08-11 DIAGNOSIS — F17210 Nicotine dependence, cigarettes, uncomplicated: Secondary | ICD-10-CM

## 2014-08-18 ENCOUNTER — Ambulatory Visit (INDEPENDENT_AMBULATORY_CARE_PROVIDER_SITE_OTHER)
Admission: RE | Admit: 2014-08-18 | Discharge: 2014-08-18 | Disposition: A | Discharge status: Home / Self Care | Payer: Medicare Other | Source: Ambulatory Visit | Attending: Acute Care | Admitting: Acute Care

## 2014-08-18 ENCOUNTER — Ambulatory Visit (INDEPENDENT_AMBULATORY_CARE_PROVIDER_SITE_OTHER): Payer: Medicare Other | Admitting: Acute Care

## 2014-08-18 ENCOUNTER — Encounter: Payer: Self-pay | Admitting: Acute Care

## 2014-08-18 ENCOUNTER — Telehealth: Payer: Self-pay | Admitting: Acute Care

## 2014-08-18 DIAGNOSIS — F17210 Nicotine dependence, cigarettes, uncomplicated: Secondary | ICD-10-CM

## 2014-08-18 DIAGNOSIS — Z87891 Personal history of nicotine dependence: Secondary | ICD-10-CM | POA: Diagnosis not present

## 2014-08-18 NOTE — Progress Notes (Signed)
Shared Decision Making Visit Lung Cancer Screening Program [id number])   Eligibility:  Age 69 y.o.  Pack Years Smoking History Calculation : 50 pack years (# packs/per year x # years smoked)  Recent History of coughing up blood  no  Unexplained weight loss? no ( >Than 15 pounds within the last 6 months )  Prior History Lung / other cancer no (Diagnosis within the last 5 years already requiring surveillance chest CT Scans).  Smoking Status Current Smoker  Former Smokers: Years since quit:NA  Quit Date: NA  Visit Components:  Discussion included one or more decision making aids. yes  Discussion included risk/benefits of screening. yes  Discussion included potential follow up diagnostic testing for abnormal scans. yes  Discussion included meaning and risk of over diagnosis. yes  Discussion included meaning and risk of False Positives. yes  Discussion included meaning of total radiation exposure. yes  Counseling Included:  Importance of adherence to annual lung cancer LDCT screening. yes  Impact of comorbidities on ability to participate in the program. yes  Ability and willingness to under diagnostic treatment. yes  Smoking Cessation Counseling:  Current Smokers:   Discussed importance of smoking cessation. yes  Information about tobacco cessation classes and interventions provided to patient. yes  Patient provided with "ticket" for LDCT Scan. yes  Symptomatic Patient. no  Counseling:NA  Diagnosis Code: Tobacco Use Z72.0  Asymptomatic Patient yes  Counseling (Intermediate counseling: > three minutes counseling) D1497  Former Smokers:   Discussed the importance of maintaining cigarette abstinence. NA, current smoker  Diagnosis Code: Personal History of Nicotine Dependence. W26.378  Information about tobacco cessation classes and interventions provided to patient. Yes  Patient provided with "ticket" for LDCT Scan. yes  Written Order for Lung Cancer  Screening with LDCT placed in Epic. Yes (CT Chest Lung Cancer Screening Low Dose W/O CM) [id number] Z12.2-Screening of respiratory organs Z87.891-Personal history of nicotine dependence  I spent 20 minutes with Mrs. Torregrossa discussing the risks and benefits of Lung Cancer screening. We viewed a power point together, pausing at intervals to allow for questions to be asked and answered.We addressed all of the topics noted above, ensuring understanding of each one. I told Ms. Evans that the single most powerfull action that she can take to decrease her risk of Lung Cancer is to stop smoking.She is not ready to try at present. I have told her that when she is ready to really try quitting to let me know and I will do anything I can as a health care provider to help her.She has my contact information. I also gave her the " Be stronger than your excuses" card, and encouraged her to reach out to the community and state resources available to help with the challenge of quitting. We spent time discussing the process of screening. She understands that this is not a one time test, but an annual screening that allows [place] to look for changes in lung nodules over time. We discussed time and location of her screening scan.ITake away's from out appointment also include a copy of the power point we viewed together to allow as a reference for any further questions. I have also encouraged her to call me any time if she has any questions. Ms. Ramey verbalized understanding of all of the above, and had no further questions before leaving my office.   Magdalen Spatz, NP

## 2014-08-18 NOTE — Telephone Encounter (Signed)
I called Ms. Mcneff and gave her the results of her lung cancer screening scan. The scan resulted in a Lung RADS 2, nodules with a very low likelihood of becoming a clinically active cancer based on size and lack of growth. Recommendation is for follow up scan in 12 months. I told her I would call her in 11 months to get her next scan scheduled. I also reminded her that if she has any changes in her health, such as unexplained weight loss or coughing up blood to call Dr. Inda Merlin, or myself for immediate follow up. She verbalized understanding of all of the above. She has my contact information in the event she has any further questions. I have faxed the scan results to Dr. Mertha Finders, as he was the referring MD, and is Ms. Lipps's PCP.

## 2014-10-26 DIAGNOSIS — L570 Actinic keratosis: Secondary | ICD-10-CM | POA: Diagnosis not present

## 2014-10-26 DIAGNOSIS — L814 Other melanin hyperpigmentation: Secondary | ICD-10-CM | POA: Diagnosis not present

## 2014-10-26 DIAGNOSIS — L821 Other seborrheic keratosis: Secondary | ICD-10-CM | POA: Diagnosis not present

## 2014-10-26 DIAGNOSIS — L918 Other hypertrophic disorders of the skin: Secondary | ICD-10-CM | POA: Diagnosis not present

## 2014-10-26 DIAGNOSIS — L920 Granuloma annulare: Secondary | ICD-10-CM | POA: Diagnosis not present

## 2014-10-26 DIAGNOSIS — D485 Neoplasm of uncertain behavior of skin: Secondary | ICD-10-CM | POA: Diagnosis not present

## 2014-10-31 DIAGNOSIS — J018 Other acute sinusitis: Secondary | ICD-10-CM | POA: Diagnosis not present

## 2014-10-31 DIAGNOSIS — J209 Acute bronchitis, unspecified: Secondary | ICD-10-CM | POA: Diagnosis not present

## 2014-11-16 DIAGNOSIS — Z23 Encounter for immunization: Secondary | ICD-10-CM | POA: Diagnosis not present

## 2014-12-16 ENCOUNTER — Encounter: Payer: Self-pay | Admitting: Internal Medicine

## 2015-02-28 DIAGNOSIS — R05 Cough: Secondary | ICD-10-CM | POA: Diagnosis not present

## 2015-02-28 DIAGNOSIS — J00 Acute nasopharyngitis [common cold]: Secondary | ICD-10-CM | POA: Diagnosis not present

## 2015-04-05 ENCOUNTER — Other Ambulatory Visit: Payer: Self-pay | Admitting: Acute Care

## 2015-04-05 DIAGNOSIS — F17210 Nicotine dependence, cigarettes, uncomplicated: Secondary | ICD-10-CM

## 2015-07-06 DIAGNOSIS — H2511 Age-related nuclear cataract, right eye: Secondary | ICD-10-CM | POA: Diagnosis not present

## 2015-07-06 DIAGNOSIS — H35371 Puckering of macula, right eye: Secondary | ICD-10-CM | POA: Diagnosis not present

## 2015-07-06 DIAGNOSIS — H25011 Cortical age-related cataract, right eye: Secondary | ICD-10-CM | POA: Diagnosis not present

## 2015-07-06 DIAGNOSIS — H25012 Cortical age-related cataract, left eye: Secondary | ICD-10-CM | POA: Diagnosis not present

## 2015-07-06 DIAGNOSIS — H2512 Age-related nuclear cataract, left eye: Secondary | ICD-10-CM | POA: Diagnosis not present

## 2015-07-06 DIAGNOSIS — H3509 Other intraretinal microvascular abnormalities: Secondary | ICD-10-CM | POA: Diagnosis not present

## 2015-08-19 ENCOUNTER — Ambulatory Visit (INDEPENDENT_AMBULATORY_CARE_PROVIDER_SITE_OTHER)
Admission: RE | Admit: 2015-08-19 | Discharge: 2015-08-19 | Disposition: A | Discharge status: Home / Self Care | Payer: Medicare Other | Source: Ambulatory Visit | Attending: Acute Care | Admitting: Acute Care

## 2015-08-19 DIAGNOSIS — Z87891 Personal history of nicotine dependence: Secondary | ICD-10-CM | POA: Diagnosis not present

## 2015-08-19 DIAGNOSIS — F17210 Nicotine dependence, cigarettes, uncomplicated: Secondary | ICD-10-CM

## 2015-08-25 ENCOUNTER — Telehealth: Payer: Self-pay | Admitting: Acute Care

## 2015-08-25 ENCOUNTER — Other Ambulatory Visit: Payer: Self-pay | Admitting: Acute Care

## 2015-08-25 DIAGNOSIS — F17210 Nicotine dependence, cigarettes, uncomplicated: Secondary | ICD-10-CM

## 2015-08-25 NOTE — Telephone Encounter (Signed)
I have called the results of Nicole Mercer's Low Dose CT to her. I explained that her scan was read as a Lung RADS 4 A : suspicious findings, either short term follow up in 3 months or alternatively  PET Scan evaluation may be considered when there is a solid component of  8 mm or larger.I explained that I reviewed the scan with Dr. Lamonte Sakai, who feels the scan should be repeated in 3 months, as suggested by radiology. The patient states she has had an upper respiratory cough with increase in secretions for about 2 months, and that she has an appointment with Dr. Inda Merlin within the next 2 weeks. I explained that often times upper respiratory infections complicate these scans, and follow up scans at a shorter interval to assure clearing are recommended. I explained that per Dr. Lamonte Sakai, we will repeat the scan in 3 months. I encouraged her to move her appointment with Dr. Inda Merlin forward if possible. I will forward the CT results to Dr. Inda Merlin for inclusion in her chart. She verbalized understanding of the above and had no further questions.

## 2015-08-26 ENCOUNTER — Other Ambulatory Visit: Payer: Self-pay | Admitting: Internal Medicine

## 2015-08-26 DIAGNOSIS — J449 Chronic obstructive pulmonary disease, unspecified: Secondary | ICD-10-CM

## 2015-08-31 DIAGNOSIS — Z6822 Body mass index (BMI) 22.0-22.9, adult: Secondary | ICD-10-CM | POA: Diagnosis not present

## 2015-08-31 DIAGNOSIS — R319 Hematuria, unspecified: Secondary | ICD-10-CM | POA: Diagnosis not present

## 2015-08-31 DIAGNOSIS — Z1231 Encounter for screening mammogram for malignant neoplasm of breast: Secondary | ICD-10-CM | POA: Diagnosis not present

## 2015-08-31 DIAGNOSIS — N390 Urinary tract infection, site not specified: Secondary | ICD-10-CM | POA: Diagnosis not present

## 2015-08-31 DIAGNOSIS — Z01419 Encounter for gynecological examination (general) (routine) without abnormal findings: Secondary | ICD-10-CM | POA: Diagnosis not present

## 2015-09-02 DIAGNOSIS — K219 Gastro-esophageal reflux disease without esophagitis: Secondary | ICD-10-CM | POA: Diagnosis not present

## 2015-09-02 DIAGNOSIS — Z79899 Other long term (current) drug therapy: Secondary | ICD-10-CM | POA: Diagnosis not present

## 2015-09-02 DIAGNOSIS — F17201 Nicotine dependence, unspecified, in remission: Secondary | ICD-10-CM | POA: Diagnosis not present

## 2015-09-02 DIAGNOSIS — J449 Chronic obstructive pulmonary disease, unspecified: Secondary | ICD-10-CM | POA: Diagnosis not present

## 2015-09-02 DIAGNOSIS — R05 Cough: Secondary | ICD-10-CM | POA: Diagnosis not present

## 2015-09-02 DIAGNOSIS — Z1389 Encounter for screening for other disorder: Secondary | ICD-10-CM | POA: Diagnosis not present

## 2015-09-02 DIAGNOSIS — R002 Palpitations: Secondary | ICD-10-CM | POA: Diagnosis not present

## 2015-09-02 DIAGNOSIS — Z0000 Encounter for general adult medical examination without abnormal findings: Secondary | ICD-10-CM | POA: Diagnosis not present

## 2015-09-02 DIAGNOSIS — R3121 Asymptomatic microscopic hematuria: Secondary | ICD-10-CM | POA: Diagnosis not present

## 2015-09-02 DIAGNOSIS — K5730 Diverticulosis of large intestine without perforation or abscess without bleeding: Secondary | ICD-10-CM | POA: Diagnosis not present

## 2015-09-02 DIAGNOSIS — G4700 Insomnia, unspecified: Secondary | ICD-10-CM | POA: Diagnosis not present

## 2015-09-02 DIAGNOSIS — J309 Allergic rhinitis, unspecified: Secondary | ICD-10-CM | POA: Diagnosis not present

## 2015-09-02 DIAGNOSIS — F329 Major depressive disorder, single episode, unspecified: Secondary | ICD-10-CM | POA: Diagnosis not present

## 2015-09-02 DIAGNOSIS — M5432 Sciatica, left side: Secondary | ICD-10-CM | POA: Diagnosis not present

## 2015-11-21 ENCOUNTER — Other Ambulatory Visit: Payer: Self-pay | Admitting: Acute Care

## 2015-11-21 ENCOUNTER — Telehealth: Payer: Self-pay | Admitting: Acute Care

## 2015-11-21 ENCOUNTER — Ambulatory Visit
Admission: RE | Admit: 2015-11-21 | Discharge: 2015-11-21 | Disposition: A | Discharge status: Home / Self Care | Payer: Medicare Other | Source: Ambulatory Visit | Attending: Internal Medicine | Admitting: Internal Medicine

## 2015-11-21 DIAGNOSIS — F17210 Nicotine dependence, cigarettes, uncomplicated: Secondary | ICD-10-CM

## 2015-11-21 DIAGNOSIS — Z87891 Personal history of nicotine dependence: Secondary | ICD-10-CM

## 2015-11-21 DIAGNOSIS — Z08 Encounter for follow-up examination after completed treatment for malignant neoplasm: Secondary | ICD-10-CM | POA: Diagnosis not present

## 2015-11-21 NOTE — Telephone Encounter (Signed)
I have called Nicole Mercer with the results of her LDCT. Her scan was read as a Lung RADS 2: nodules that are benign in appearance and behavior with a very low likelihood of becoming a clinically active cancer due to size or lack of growth. Recommendation per radiology is for a repeat LDCT in 12 months.We will order and schedule the scan for 11/2016. I also explained that the scan indicated aortic atherosclerosis.I explained that with this non-gated exam degree or severity cannot be determined.She is not taking medication for cholesterol, but has her cholesterol and triglycerides checked annually by her PCP. I will fax the results to her PCP for completeness of her medical record.Nicole Mercer verbalized understanding of the above and had no further questions upon completion of the call.

## 2016-03-25 DIAGNOSIS — Z9289 Personal history of other medical treatment: Secondary | ICD-10-CM | POA: Diagnosis not present

## 2016-03-25 DIAGNOSIS — J029 Acute pharyngitis, unspecified: Secondary | ICD-10-CM | POA: Diagnosis not present

## 2016-03-25 DIAGNOSIS — R05 Cough: Secondary | ICD-10-CM | POA: Diagnosis not present

## 2016-03-25 DIAGNOSIS — J00 Acute nasopharyngitis [common cold]: Secondary | ICD-10-CM | POA: Diagnosis not present

## 2016-04-02 DIAGNOSIS — J329 Chronic sinusitis, unspecified: Secondary | ICD-10-CM | POA: Diagnosis not present

## 2016-04-02 DIAGNOSIS — J209 Acute bronchitis, unspecified: Secondary | ICD-10-CM | POA: Diagnosis not present

## 2016-04-02 DIAGNOSIS — J9801 Acute bronchospasm: Secondary | ICD-10-CM | POA: Diagnosis not present

## 2016-08-14 DIAGNOSIS — M7541 Impingement syndrome of right shoulder: Secondary | ICD-10-CM | POA: Diagnosis not present

## 2016-08-31 DIAGNOSIS — Z6822 Body mass index (BMI) 22.0-22.9, adult: Secondary | ICD-10-CM | POA: Diagnosis not present

## 2016-08-31 DIAGNOSIS — R21 Rash and other nonspecific skin eruption: Secondary | ICD-10-CM | POA: Diagnosis not present

## 2016-08-31 DIAGNOSIS — Z1389 Encounter for screening for other disorder: Secondary | ICD-10-CM | POA: Diagnosis not present

## 2016-08-31 DIAGNOSIS — T783XXA Angioneurotic edema, initial encounter: Secondary | ICD-10-CM | POA: Diagnosis not present

## 2016-09-12 DIAGNOSIS — J449 Chronic obstructive pulmonary disease, unspecified: Secondary | ICD-10-CM | POA: Diagnosis not present

## 2016-09-12 DIAGNOSIS — R1013 Epigastric pain: Secondary | ICD-10-CM | POA: Diagnosis not present

## 2016-09-12 DIAGNOSIS — R252 Cramp and spasm: Secondary | ICD-10-CM | POA: Diagnosis not present

## 2016-09-12 DIAGNOSIS — E559 Vitamin D deficiency, unspecified: Secondary | ICD-10-CM | POA: Diagnosis not present

## 2016-09-12 DIAGNOSIS — B9681 Helicobacter pylori [H. pylori] as the cause of diseases classified elsewhere: Secondary | ICD-10-CM | POA: Diagnosis not present

## 2016-09-12 DIAGNOSIS — L509 Urticaria, unspecified: Secondary | ICD-10-CM | POA: Diagnosis not present

## 2016-09-12 DIAGNOSIS — E538 Deficiency of other specified B group vitamins: Secondary | ICD-10-CM | POA: Diagnosis not present

## 2016-09-12 DIAGNOSIS — I700 Atherosclerosis of aorta: Secondary | ICD-10-CM | POA: Diagnosis not present

## 2016-09-12 DIAGNOSIS — Z0000 Encounter for general adult medical examination without abnormal findings: Secondary | ICD-10-CM | POA: Diagnosis not present

## 2016-09-12 DIAGNOSIS — R202 Paresthesia of skin: Secondary | ICD-10-CM | POA: Diagnosis not present

## 2016-09-12 DIAGNOSIS — R311 Benign essential microscopic hematuria: Secondary | ICD-10-CM | POA: Diagnosis not present

## 2016-09-12 DIAGNOSIS — Z79899 Other long term (current) drug therapy: Secondary | ICD-10-CM | POA: Diagnosis not present

## 2016-09-12 DIAGNOSIS — F322 Major depressive disorder, single episode, severe without psychotic features: Secondary | ICD-10-CM | POA: Diagnosis not present

## 2016-09-26 DIAGNOSIS — L509 Urticaria, unspecified: Secondary | ICD-10-CM | POA: Diagnosis not present

## 2016-09-26 DIAGNOSIS — B9681 Helicobacter pylori [H. pylori] as the cause of diseases classified elsewhere: Secondary | ICD-10-CM | POA: Diagnosis not present

## 2016-09-26 DIAGNOSIS — R1013 Epigastric pain: Secondary | ICD-10-CM | POA: Diagnosis not present

## 2016-09-26 DIAGNOSIS — K219 Gastro-esophageal reflux disease without esophagitis: Secondary | ICD-10-CM | POA: Diagnosis not present

## 2016-09-26 DIAGNOSIS — I700 Atherosclerosis of aorta: Secondary | ICD-10-CM | POA: Diagnosis not present

## 2016-10-02 ENCOUNTER — Encounter: Payer: Self-pay | Admitting: Internal Medicine

## 2016-10-02 ENCOUNTER — Other Ambulatory Visit: Payer: Self-pay | Admitting: Internal Medicine

## 2016-10-02 ENCOUNTER — Telehealth: Payer: Self-pay | Admitting: Internal Medicine

## 2016-10-02 DIAGNOSIS — F17200 Nicotine dependence, unspecified, uncomplicated: Secondary | ICD-10-CM

## 2016-10-02 NOTE — Telephone Encounter (Signed)
OK 

## 2016-10-02 NOTE — Telephone Encounter (Signed)
Patient has been notified of this and has been scheduled for an office visit with Dr.Gessner.

## 2016-10-03 DIAGNOSIS — M7541 Impingement syndrome of right shoulder: Secondary | ICD-10-CM | POA: Diagnosis not present

## 2016-10-18 ENCOUNTER — Ambulatory Visit
Admission: RE | Admit: 2016-10-18 | Discharge: 2016-10-18 | Disposition: A | Discharge status: Home / Self Care | Payer: Federal, State, Local not specified - PPO | Source: Ambulatory Visit | Attending: Internal Medicine | Admitting: Internal Medicine

## 2016-10-18 DIAGNOSIS — F17200 Nicotine dependence, unspecified, uncomplicated: Secondary | ICD-10-CM

## 2016-10-18 DIAGNOSIS — F17210 Nicotine dependence, cigarettes, uncomplicated: Secondary | ICD-10-CM | POA: Diagnosis not present

## 2016-10-18 DIAGNOSIS — Z136 Encounter for screening for cardiovascular disorders: Secondary | ICD-10-CM | POA: Diagnosis not present

## 2016-10-22 DIAGNOSIS — Z6823 Body mass index (BMI) 23.0-23.9, adult: Secondary | ICD-10-CM | POA: Diagnosis not present

## 2016-10-22 DIAGNOSIS — R319 Hematuria, unspecified: Secondary | ICD-10-CM | POA: Diagnosis not present

## 2016-10-22 DIAGNOSIS — Z124 Encounter for screening for malignant neoplasm of cervix: Secondary | ICD-10-CM | POA: Diagnosis not present

## 2016-10-22 DIAGNOSIS — Z1231 Encounter for screening mammogram for malignant neoplasm of breast: Secondary | ICD-10-CM | POA: Diagnosis not present

## 2016-10-22 DIAGNOSIS — N390 Urinary tract infection, site not specified: Secondary | ICD-10-CM | POA: Diagnosis not present

## 2016-11-05 ENCOUNTER — Other Ambulatory Visit: Payer: Self-pay | Admitting: Acute Care

## 2016-11-05 DIAGNOSIS — Z122 Encounter for screening for malignant neoplasm of respiratory organs: Secondary | ICD-10-CM

## 2016-11-05 DIAGNOSIS — Z87891 Personal history of nicotine dependence: Secondary | ICD-10-CM

## 2016-11-21 ENCOUNTER — Ambulatory Visit
Admission: RE | Admit: 2016-11-21 | Discharge: 2016-11-21 | Disposition: A | Discharge status: Home / Self Care | Payer: Federal, State, Local not specified - PPO | Source: Ambulatory Visit | Attending: Acute Care | Admitting: Acute Care

## 2016-11-21 DIAGNOSIS — Z87891 Personal history of nicotine dependence: Secondary | ICD-10-CM | POA: Diagnosis not present

## 2016-11-21 DIAGNOSIS — Z122 Encounter for screening for malignant neoplasm of respiratory organs: Secondary | ICD-10-CM

## 2016-11-27 ENCOUNTER — Ambulatory Visit (INDEPENDENT_AMBULATORY_CARE_PROVIDER_SITE_OTHER): Payer: Medicare Other | Admitting: Internal Medicine

## 2016-11-27 ENCOUNTER — Encounter: Payer: Self-pay | Admitting: Internal Medicine

## 2016-11-27 ENCOUNTER — Other Ambulatory Visit: Payer: Federal, State, Local not specified - PPO

## 2016-11-27 VITALS — BP 120/78 | HR 72 | Ht 66.0 in | Wt 144.2 lb

## 2016-11-27 DIAGNOSIS — L509 Urticaria, unspecified: Secondary | ICD-10-CM

## 2016-11-27 DIAGNOSIS — R1013 Epigastric pain: Secondary | ICD-10-CM | POA: Diagnosis not present

## 2016-11-27 DIAGNOSIS — R12 Heartburn: Secondary | ICD-10-CM

## 2016-11-27 DIAGNOSIS — Z8619 Personal history of other infectious and parasitic diseases: Secondary | ICD-10-CM

## 2016-11-27 NOTE — Progress Notes (Signed)
Nicole Mercer 71 y.o. August 24, 1945 [id number]  Assessment & Plan:   Encounter Diagnoses  Name Primary?  . Dyspepsia Yes  . Heartburn   . Urticaria   . History of Helicobacter pylori infection    These seem like recurrent problems when I look at the chart further.  I wonder if her hives and urticaria related to H. pylori that has been reported.  She actually could have esophageal spasm though we do not have good concrete evidence of that.  This does not seem cardiac given the chronic recurrent nature and the lack of exertional features etc.  My plans for evaluation are:  H pylori stool Ag in 2 weeks off famotidine  If + treat though might want to consider a culture to see what this is sensitive to that would require endoscopy  If negative - try PPI, barium swallow?,  Repeat ultrasound though probably not gallstones given the number of time she has had an ultrasound without findings in the setting of the symptoms   I appreciate the opportunity to care for this patient. CC: Josetta Huddle, MD  Subjective:   Chief Complaint: Reflux burning sensation in abdomen and chest  HPI Patient is a very nice 71 year old white woman here for intermittent burning epigastric and chest pain radiating up to the jaw.  She says this is new but chart review shows that she has had intermittent symptoms like this over the years.  She is describing this occurring haphazardly lasting for 15 minutes to hours.  It is not necessarily related to eating.  It is definitely not exertional or positional and it does not disturb her sleep.  Chart review shows that in 1996 she had an EGD with a CLOtest positive and was treated with Biaxin and Prilosec for H. pylori.  Then she had a positive urea breath test in 2004 and was treated with "triple therapy".  In 2007 she had a repeat endoscopy and there was some inflammation suggesting GERD on biopsies of the GE junction and a CLOtest was done but I do not have those  results.  More recently she says her blood work from Dr. Inda Merlin shows that she is "weakly positive" for H. pylori.  She feels well in between these episodes she reports weight gain and no bleeding hematemesis or dysphagia.  She had been on a PPI for years and at 2013 visit this was discontinued, and she has recently been using famotidine 20 mg daily or twice daily without help.  She is also complaining of intermittent urticaria or hives since June when is quite troublesome.  She is on doxepin for this but no antihistamines.  It is not related to her GI symptoms temporarily but she wonders if there could be an association.  Note that over the years she has had numerous ultrasounds and has never had gallstones.  She is in a CT chest follow-up protocol for lung lesions thought most likely to be postinfectious but is due for a repeat CT of the chest in a year from now.    Note also in 2011-13 Dr. Olevia Perches thought that the patient probably had esophageal spasm causing her symptoms though I do not see any barium swallow or manometry testing to support this diagnosis.  She does drink caffeine 2-3 cups of coffee a day she does not smoke she has quit.  Colon cancer screening up-to-date last colonoscopy 2011 diverticulosis no polyps. No Known Allergies Current Meds  Medication Sig  . Calcium Citrate-Vitamin D (CITRACAL +  D PO) Take 4 capsules by mouth daily.  . Cholecalciferol (VITAMIN D3) 2000 UNITS TABS Take 1 tablet by mouth daily.  . clonazePAM (KLONOPIN) 1 MG tablet Takes one tablet by mouth at bedtime  . doxepin (SINEQUAN) 10 MG capsule Take 10 mg by mouth.  . estradiol (ESTRACE) 1 MG tablet Takes one tablet by mouth once daily  . famotidine (PEPCID) 20 MG tablet Take 20 mg 2 (two) times daily by mouth.  . progesterone (PROMETRIUM) 200 MG capsule Take by mouth as directed.   . sertraline (ZOLOFT) 100 MG tablet Takes one tablet by mouth once daily   Past Medical History:  Diagnosis Date  .  Diverticulosis   . Esophageal spasm   . GERD (gastroesophageal reflux disease)   . Helicobacter pylori gastritis    Past Surgical History:  Procedure Laterality Date  . COLONOSCOPY    . ESOPHAGOGASTRODUODENOSCOPY    . SIGMOIDOSCOPY     Social History   Socioeconomic History  . Marital status: Divorced       .    .    .    Social Needs  .    .    .    .    .    Occupational History  .  Works part-time at the [employer] and is retired from the [employer]  . Smoking status: Current Some Day Smoker    Packs/day: 1.75    Years: 30.00    Pack years: 52.50    Types: Cigarettes  . Smokeless tobacco: Never Used  Substance and Sexual Activity  . Alcohol use: Yes    Alcohol/week: 0.0 oz    Comment: Couple of times a week   . Drug use: No  . Sexual activity: Not on file  Other Topics Concern  . Not on file  Social History Narrative   Daily caffeine    family history includes Colon polyps in her mother; Diabetes in her mother and sister; Prostate cancer in her father.   Review of Systems All other review of systems are negative except as per HPI   Objective:   Physical Exam @BP  120/78   Pulse 72   Ht 5\' 6"  (1.676 m)   Wt 144 lb 4 oz (65.4 kg)   BMI 23.28 kg/m @  General:  Well-developed, well-nourished and in no acute distress Eyes:  anicteric. ENT:   Mouth and posterior pharynx free of lesions.  Neck:   supple w/o thyromegaly or mass.  Lungs: Clear to auscultation bilaterally. Heart:  S1S2, no rubs, murmurs, gallops. Abdomen:  soft, non-tender, no hepatosplenomegaly, hernia, or mass and BS+.  Lymph:  no cervical or supraclavicular adenopathy. Extremities:   no edema, cyanosis or clubbing Skin   no rash. Neuro:  A&O x 3.  Psych:  appropriate mood and  Affect.   Data Reviewed: As per HPI

## 2016-11-27 NOTE — Patient Instructions (Signed)
Your physician has requested that you go to the basement for the following lab work before leaving today: Stool studies   Stop your generic pepcid and then in 2 weeks do the stool test.   Use Gaviscon as needed.   I appreciate the opportunity to care for you. Silvano Rusk, MD, [facility]

## 2016-11-30 ENCOUNTER — Other Ambulatory Visit: Payer: Self-pay | Admitting: Acute Care

## 2016-11-30 DIAGNOSIS — Z122 Encounter for screening for malignant neoplasm of respiratory organs: Secondary | ICD-10-CM

## 2016-11-30 DIAGNOSIS — Z87891 Personal history of nicotine dependence: Secondary | ICD-10-CM

## 2016-12-11 ENCOUNTER — Other Ambulatory Visit: Payer: Federal, State, Local not specified - PPO

## 2016-12-11 DIAGNOSIS — Z8619 Personal history of other infectious and parasitic diseases: Secondary | ICD-10-CM

## 2016-12-12 DIAGNOSIS — I700 Atherosclerosis of aorta: Secondary | ICD-10-CM | POA: Diagnosis not present

## 2016-12-12 DIAGNOSIS — F509 Eating disorder, unspecified: Secondary | ICD-10-CM | POA: Diagnosis not present

## 2016-12-12 DIAGNOSIS — I2510 Atherosclerotic heart disease of native coronary artery without angina pectoris: Secondary | ICD-10-CM | POA: Diagnosis not present

## 2016-12-12 DIAGNOSIS — Z658 Other specified problems related to psychosocial circumstances: Secondary | ICD-10-CM | POA: Diagnosis not present

## 2016-12-12 DIAGNOSIS — L299 Pruritus, unspecified: Secondary | ICD-10-CM | POA: Diagnosis not present

## 2016-12-12 LAB — HELICOBACTER PYLORI  SPECIAL ANTIGEN
MICRO NUMBER: 81331406
RESULT:: DETECTED — AB
SPECIMEN QUALITY: ADEQUATE

## 2016-12-17 ENCOUNTER — Other Ambulatory Visit: Payer: Self-pay

## 2016-12-17 DIAGNOSIS — A048 Other specified bacterial intestinal infections: Secondary | ICD-10-CM

## 2016-12-17 MED ORDER — OMEPRAZOLE 20 MG PO CPDR: 20.0000 mg | DELAYED_RELEASE_CAPSULE | Freq: Two times a day (BID) | ORAL | 0 refills | Status: DC

## 2016-12-17 MED ORDER — DOXYCYCLINE HYCLATE 100 MG PO CAPS: 100.0000 mg | ORAL_CAPSULE | Freq: Two times a day (BID) | ORAL | 0 refills | Status: AC

## 2016-12-17 MED ORDER — BISMUTH SUBSALICYLATE 262 MG PO TABS: 2.0000 | ORAL_TABLET | Freq: Four times a day (QID) | ORAL | 0 refills | Status: AC

## 2016-12-17 MED ORDER — METRONIDAZOLE 250 MG PO TABS: 250.0000 mg | ORAL_TABLET | Freq: Four times a day (QID) | ORAL | 0 refills | Status: AC

## 2016-12-17 NOTE — Progress Notes (Signed)
H pylori is still there  I would like her to try the following:  1) Omeprazole 20 mg 2 times a day x 14 d 2) Pepto Bismol 2 tabs (262 mg each) 4 times a day x 14 d 3) Metronidazole 250 mg 4 times a day x 14 d 4) doxycycline 100 mg 2 times a day x 14 d  After 14 d stop omeprazole also  In 4 weeks after treatment completed do H. Pylori stool antigen - dx H. Pylori gastritis She should be off Pepcid or any other acid blockers x 2 weeks also when testing

## 2017-01-01 DIAGNOSIS — J3089 Other allergic rhinitis: Secondary | ICD-10-CM | POA: Diagnosis not present

## 2017-01-01 DIAGNOSIS — J301 Allergic rhinitis due to pollen: Secondary | ICD-10-CM | POA: Diagnosis not present

## 2017-01-01 DIAGNOSIS — R21 Rash and other nonspecific skin eruption: Secondary | ICD-10-CM | POA: Diagnosis not present

## 2017-01-01 DIAGNOSIS — R5383 Other fatigue: Secondary | ICD-10-CM | POA: Diagnosis not present

## 2017-01-04 DIAGNOSIS — L814 Other melanin hyperpigmentation: Secondary | ICD-10-CM | POA: Diagnosis not present

## 2017-01-04 DIAGNOSIS — D1801 Hemangioma of skin and subcutaneous tissue: Secondary | ICD-10-CM | POA: Diagnosis not present

## 2017-01-04 DIAGNOSIS — L821 Other seborrheic keratosis: Secondary | ICD-10-CM | POA: Diagnosis not present

## 2017-01-04 DIAGNOSIS — L501 Idiopathic urticaria: Secondary | ICD-10-CM | POA: Diagnosis not present

## 2017-01-04 DIAGNOSIS — D692 Other nonthrombocytopenic purpura: Secondary | ICD-10-CM | POA: Diagnosis not present

## 2017-01-16 ENCOUNTER — Telehealth: Payer: Self-pay

## 2017-01-16 NOTE — Telephone Encounter (Signed)
Patient notified that she will be due on 01/28/17 for h pylori stool antigen

## 2017-02-11 ENCOUNTER — Other Ambulatory Visit: Payer: Federal, State, Local not specified - PPO

## 2017-02-11 DIAGNOSIS — A048 Other specified bacterial intestinal infections: Secondary | ICD-10-CM

## 2017-02-13 ENCOUNTER — Other Ambulatory Visit: Payer: Federal, State, Local not specified - PPO

## 2017-02-13 DIAGNOSIS — Z658 Other specified problems related to psychosocial circumstances: Secondary | ICD-10-CM | POA: Diagnosis not present

## 2017-02-13 DIAGNOSIS — I700 Atherosclerosis of aorta: Secondary | ICD-10-CM | POA: Diagnosis not present

## 2017-02-13 DIAGNOSIS — I2510 Atherosclerotic heart disease of native coronary artery without angina pectoris: Secondary | ICD-10-CM | POA: Diagnosis not present

## 2017-02-13 DIAGNOSIS — A048 Other specified bacterial intestinal infections: Secondary | ICD-10-CM

## 2017-02-13 DIAGNOSIS — L299 Pruritus, unspecified: Secondary | ICD-10-CM | POA: Diagnosis not present

## 2017-02-13 DIAGNOSIS — F509 Eating disorder, unspecified: Secondary | ICD-10-CM | POA: Diagnosis not present

## 2017-02-13 DIAGNOSIS — R12 Heartburn: Secondary | ICD-10-CM | POA: Diagnosis not present

## 2017-02-14 LAB — HELICOBACTER PYLORI  SPECIAL ANTIGEN
MICRO NUMBER:: 90127812
SPECIMEN QUALITY: ADEQUATE

## 2017-02-15 ENCOUNTER — Other Ambulatory Visit: Payer: Self-pay

## 2017-02-15 MED ORDER — PANTOPRAZOLE SODIUM 40 MG PO TBEC: 40.0000 mg | DELAYED_RELEASE_TABLET | Freq: Every day | ORAL | 0 refills | Status: DC

## 2017-02-15 NOTE — Progress Notes (Signed)
That pain would not come from H pylori gastritis  Reflux may be possible cause  If she wants can retry PPI - pantoprazole 40 mg qd before breakfast # 90 no refill  See me next avail - 2 mos

## 2017-02-15 NOTE — Progress Notes (Signed)
H pylori is gone.  Please get a GI symptom update

## 2017-02-21 ENCOUNTER — Telehealth: Payer: Self-pay | Admitting: Internal Medicine

## 2017-02-21 MED ORDER — PANTOPRAZOLE SODIUM 40 MG PO TBEC: 40.0000 mg | DELAYED_RELEASE_TABLET | Freq: Every day | ORAL | 0 refills | Status: DC

## 2017-02-21 NOTE — Telephone Encounter (Signed)
Patient wanted to schedule office visit and seh says [facility] did not get her rx.  Rx sent again and follow up arranged for April

## 2017-02-21 NOTE — Telephone Encounter (Signed)
Patient requesting call back from nurse [facility] to discuss medication and appt

## 2017-02-26 DIAGNOSIS — L501 Idiopathic urticaria: Secondary | ICD-10-CM | POA: Diagnosis not present

## 2017-02-26 DIAGNOSIS — L821 Other seborrheic keratosis: Secondary | ICD-10-CM | POA: Diagnosis not present

## 2017-04-26 ENCOUNTER — Encounter: Payer: Self-pay | Admitting: Internal Medicine

## 2017-04-26 ENCOUNTER — Encounter (INDEPENDENT_AMBULATORY_CARE_PROVIDER_SITE_OTHER): Payer: Self-pay

## 2017-04-26 ENCOUNTER — Ambulatory Visit (INDEPENDENT_AMBULATORY_CARE_PROVIDER_SITE_OTHER): Payer: Medicare Other | Admitting: Internal Medicine

## 2017-04-26 VITALS — BP 104/68 | HR 72 | Ht 66.0 in | Wt 142.1 lb

## 2017-04-26 DIAGNOSIS — R1310 Dysphagia, unspecified: Secondary | ICD-10-CM

## 2017-04-26 DIAGNOSIS — K219 Gastro-esophageal reflux disease without esophagitis: Secondary | ICD-10-CM

## 2017-04-26 MED ORDER — PANTOPRAZOLE SODIUM 40 MG PO TBEC: 40.0000 mg | DELAYED_RELEASE_TABLET | Freq: Every day | ORAL | 0 refills | Status: DC

## 2017-04-26 NOTE — Patient Instructions (Signed)
  You have been scheduled for an endoscopy. Please follow written instructions given to you at your visit today. If you use inhalers (even only as needed), please bring them with you on the day of your procedure. Your physician has requested that you go to www.startemmi.com and enter the access code given to you at your visit today. This web site gives a general overview about your procedure. However, you should still follow specific instructions given to you by our office regarding your preparation for the procedure.   Stay on your pantoprazole.    I appreciate the opportunity to care for you. Silvano Rusk, MD, [facility]

## 2017-04-26 NOTE — Progress Notes (Signed)
Nicole Mercer 72 y.o. [birth year] [id number]  Assessment & Plan:   Encounter Diagnoses  Name Primary?  Marland Kitchen Dysphagia, unspecified type Yes  . Gastroesophageal reflux disease, esophagitis presence not specified   . H. pylori infection     May be more of a globus sensation then dysphagia but it is an abnormality. Overall much better after H pylori eradication and now daily PPI + H2 B qhs  Will schedule an EGD w/ possible esophageal dilation for 4-6 weeks from now.  If sxs resolves completely while waiting she may cancel.  She is aware that smoking is a health concern and that pushing her towards having the EGD as well.  She calls herself a semi-reformed smoker.  Will emphasize need to quit again when I see her at EGD or follow-up.  I appreciate the opportunity to care for her.  YK:DXIPJ, Herbie Baltimore, MD     Subjective:   Chief Complaint: dysphagia sxs, epigastric tenderness, heartburn  HPI The patient is here for follow-up, I had seen her in the fall with dyspepsia and she tested positive for H. pylori which she had been treated for off and on over the years.  She was treated with quadruple therapy and eradication was confirmed with a negative repeat stool antigen.  She still complained of heartburn sensations going up to the jaw and some epigastric tenderness so I started her on a PPI and asked her to come for follow-up.  Pantoprazole 40 mg daily was prescribed and she said it was not helping enough so she took 2 in the morning and 80 mg for a while and is now down to 40 mg daily.  She still takes famotidine over-the-counter strength at bedtime.  She feels much better but for the past few weeks she has had a sensation of something in her throat after she eats and she has had to cough up or spit up some saliva at times.  The active swallowing is generally okay and there is no suggestion of aspiration but this bothers her.  She denies any significant stressors, sometimes there is a  globus sensation in between meals but most of the time it is after eating it feels like she has not completely emptied that area.  Wt Readings from Last 3 Encounters:  04/26/17 142 lb 2 oz (64.5 kg)  11/27/16 144 lb 4 oz (65.4 kg)  09/21/11 145 lb (65.8 kg)    No Known Allergies Current Meds  Medication Sig  . Calcium Citrate-Vitamin D (CITRACAL + D PO) Take 4 capsules by mouth daily.  . cetirizine (ZYRTEC) 10 MG tablet Take 10 mg by mouth every morning.  . Cholecalciferol (VITAMIN D3) 2000 UNITS TABS Take 1 tablet by mouth daily.  . clonazePAM (KLONOPIN) 1 MG tablet Takes one tablet by mouth at bedtime  . doxepin (SINEQUAN) 10 MG capsule Take 10 mg by mouth.  . estradiol (ESTRACE) 1 MG tablet Takes one tablet by mouth once daily  . famotidine (PEPCID) 20 MG tablet Take 20 mg by mouth daily.   . hydrOXYzine (ATARAX/VISTARIL) 25 MG tablet Take 25 mg by mouth at bedtime.  . pantoprazole (PROTONIX) 40 MG tablet Take 1 tablet (40 mg total) by mouth daily before breakfast.  . progesterone (PROMETRIUM) 200 MG capsule Take by mouth at bedtime. Every 3 months  . sertraline (ZOLOFT) 100 MG tablet Takes one tablet by mouth once daily  . topiramate (TOPAMAX) 25 MG capsule Take 25 mg by mouth as needed.  . [DISCONTINUED]  pantoprazole (PROTONIX) 40 MG tablet Take 1 tablet (40 mg total) by mouth daily before breakfast. (Patient taking differently: Take 80 mg by mouth daily before breakfast. )   Past Medical History:  Diagnosis Date  . Diverticulosis   . Esophageal spasm   . GERD (gastroesophageal reflux disease)   . Helicobacter pylori gastritis    Past Surgical History:  Procedure Laterality Date  . COLONOSCOPY    . ESOPHAGOGASTRODUODENOSCOPY    . SIGMOIDOSCOPY     Social History   Social History Narrative   She is retired from [employer] system, she works part-time at the [employer].  2 sons one daughter   Divorced   Daily caffeine    family history includes Colon polyps  in her mother; Diabetes in her mother and sister; Prostate cancer in her father.   Review of Systems As above  Objective:   Physical Exam BP 104/68   Pulse 72   Ht 5\' 6"  (1.676 m)   Wt 142 lb 2 oz (64.5 kg)   BMI 22.94 kg/m  Well-developed well-nourished no acute distress  15 minutes time spent with patient > half in counseling coordination of care

## 2017-05-12 ENCOUNTER — Other Ambulatory Visit: Payer: Self-pay | Admitting: Internal Medicine

## 2017-05-28 ENCOUNTER — Encounter: Payer: Medicare Other | Admitting: Internal Medicine

## 2017-06-17 ENCOUNTER — Other Ambulatory Visit: Payer: Self-pay

## 2017-06-17 ENCOUNTER — Encounter: Payer: Self-pay | Admitting: Internal Medicine

## 2017-06-17 ENCOUNTER — Ambulatory Visit (AMBULATORY_SURGERY_CENTER): Payer: Medicare Other | Admitting: Internal Medicine

## 2017-06-17 VITALS — BP 144/72 | HR 69 | Temp 97.1°F | Resp 11 | Ht 66.0 in | Wt 142.0 lb

## 2017-06-17 DIAGNOSIS — K2960 Other gastritis without bleeding: Secondary | ICD-10-CM | POA: Diagnosis not present

## 2017-06-17 DIAGNOSIS — K219 Gastro-esophageal reflux disease without esophagitis: Secondary | ICD-10-CM | POA: Diagnosis not present

## 2017-06-17 DIAGNOSIS — R1310 Dysphagia, unspecified: Secondary | ICD-10-CM | POA: Diagnosis not present

## 2017-06-17 DIAGNOSIS — K2950 Unspecified chronic gastritis without bleeding: Secondary | ICD-10-CM | POA: Diagnosis not present

## 2017-06-17 MED ORDER — SODIUM CHLORIDE 0.9 % IV SOLN: 500.0000 mL | Freq: Once | INTRAVENOUS | Status: DC

## 2017-06-17 NOTE — Progress Notes (Signed)
Report to PACU, RN, vss, BBS= Clear.  

## 2017-06-17 NOTE — Patient Instructions (Addendum)
There were erosions (tiny ulcers) in the stomach. I took biopsies to understand cause. Infection with H pylori and medication side effects are 2 common causes. Do not think reklated to yout symptoms.  I also dilated the esophagus to see if that would help.  I will contact you with biopsy results and reassess/recommend.  I appreciate the opportunity to care for you. Gatha Mayer, MD, [facility]   Follow dilation diet.  Information given to you on gastritis.  Await pathology results.  Restart pantoprazole.   YOU HAD AN ENDOSCOPIC PROCEDURE TODAY AT [facility]:   Refer to the procedure report that was given to you for any specific questions about what was found during the examination.  If the procedure report does not answer your questions, please call your gastroenterologist to clarify.  If you requested that your care partner not be given the details of your procedure findings, then the procedure report has been included in a sealed envelope for you to review at your convenience later.  YOU SHOULD EXPECT: Some feelings of bloating in the abdomen. Passage of more gas than usual.  Walking can help get rid of the air that was put into your GI tract during the procedure and reduce the bloating. If you had a lower endoscopy (such as a colonoscopy or flexible sigmoidoscopy) you may notice spotting of blood in your stool or on the toilet paper. If you underwent a bowel prep for your procedure, you may not have a normal bowel movement for a few days.  Please Note:  You might notice some irritation and congestion in your nose or some drainage.  This is from the oxygen used during your procedure.  There is no need for concern and it should clear up in a day or so.  SYMPTOMS TO REPORT IMMEDIATELY:    Following upper endoscopy (EGD)  Vomiting of blood or coffee ground material  New chest pain or pain under the shoulder blades  Painful or persistently difficult  swallowing  New shortness of breath  Fever of 100F or higher  Black, tarry-looking stools  For urgent or emergent issues, a gastroenterologist can be reached at any hour by calling [phone].   DIET: dilatation diet today   ACTIVITY:  You should plan to take it easy for the rest of today and you should NOT DRIVE or use heavy machinery until tomorrow (because of the sedation medicines used during the test).    FOLLOW UP: Our staff will call the number listed on your records the next business day following your procedure to check on you and address any questions or concerns that you may have regarding the information given to you following your procedure. If we do not reach you, we will leave a message.  However, if you are feeling well and you are not experiencing any problems, there is no need to return our call.  We will assume that you have returned to your regular daily activities without incident.  If any biopsies were taken you will be contacted by phone or by letter within the next 1-3 weeks.  Please call us at [phone] if you have not heard about the biopsies in 3 weeks.    SIGNATURES/CONFIDENTIALITY: You and/or your care partner have signed paperwork which will be entered into your electronic medical record.  These signatures attest to the fact that that the information above on your After Visit Summary has been reviewed and is understood.  Full responsibility of  the confidentiality of this discharge information lies with you and/or your care-partner. 

## 2017-06-17 NOTE — Progress Notes (Signed)
Front tooth- rebuilt twice- upper front right tooth/.  Called to room to assist during endoscopic procedure.  Patient ID and intended procedure confirmed with present staff. Received instructions for my participation in the procedure from the performing physician.

## 2017-06-17 NOTE — Op Note (Signed)
[place] Patient Name: Artha Stavros Procedure Date: 06/17/2017 10:19 AM MRN: [id number] Endoscopist: Gatha Mayer , MD Age: 72 Referring MD:  Date of Birth: 17-Oct-1945 Gender: Female Account #: [id number] Procedure:                Upper GI endoscopy Indications:              Dysphagia, Globus sensation Medicines:                Propofol per Anesthesia, Monitored Anesthesia Care Procedure:                Pre-Anesthesia Assessment:                           - Prior to the procedure, a History and Physical                            was performed, and patient medications and                            allergies were reviewed. The patient's tolerance of                            previous anesthesia was also reviewed. The risks                            and benefits of the procedure and the sedation                            options and risks were discussed with the patient.                            All questions were answered, and informed consent                            was obtained. Prior Anticoagulants: The patient has                            taken no previous anticoagulant or antiplatelet                            agents. ASA Grade Assessment: II - A patient with                            mild systemic disease. After reviewing the risks                            and benefits, the patient was deemed in                            satisfactory condition to undergo the procedure.                           After obtaining informed consent, the endoscope was  passed under direct vision. Throughout the                            procedure, the patient's blood pressure, pulse, and                            oxygen saturations were monitored continuously. The                            Endoscope was introduced through the mouth, and                            advanced to the second part of duodenum. The upper                            GI  endoscopy was accomplished without difficulty.                            The patient tolerated the procedure well. Scope In: Scope Out: Findings:                 Multiple dispersed, small non-bleeding erosions                            were found in the gastric antrum. Biopsies were                            taken with a cold forceps for histology.                            Verification of patient identification for the                            specimen was done. Estimated blood loss was minimal.                           The exam was otherwise without abnormality.                           The cardia and gastric fundus were normal on                            retroflexion.                           The scope was withdrawn. Dilation was performed in                            the entire esophagus with a Maloney dilator with                            mild resistance at 54 Fr. Estimated blood loss:                            none. Complications:  No immediate complications. Estimated Blood Loss:     Estimated blood loss was minimal. Impression:               - Non-bleeding erosive gastropathy. Biopsied.                           - The examination was otherwise normal.                           - Dilation performed in the entire esophagus. Recommendation:           - Patient has a contact number available for                            emergencies. The signs and symptoms of potential                            delayed complications were discussed with the                            patient. Return to normal activities tomorrow.                            Written discharge instructions were provided to the                            patient.                           - Resume previous diet.                           - Continue present medications.                           - Await pathology results.                           - No NSAID on med list ? if erosions from that -                             she has held PPI x 2 weeks.                           see if dilation helped when I call path results Gatha Mayer, MD 06/17/2017 10:48:10 AM This report has been signed electronically.

## 2017-06-18 ENCOUNTER — Telehealth: Payer: Self-pay | Admitting: *Deleted

## 2017-06-18 DIAGNOSIS — K2960 Other gastritis without bleeding: Secondary | ICD-10-CM

## 2017-06-18 DIAGNOSIS — K219 Gastro-esophageal reflux disease without esophagitis: Secondary | ICD-10-CM

## 2017-06-18 DIAGNOSIS — R1013 Epigastric pain: Secondary | ICD-10-CM

## 2017-06-18 MED ORDER — PANTOPRAZOLE SODIUM 40 MG PO TBEC: 40.0000 mg | DELAYED_RELEASE_TABLET | Freq: Every day | ORAL | 0 refills | Status: DC

## 2017-06-18 MED ORDER — PANTOPRAZOLE SODIUM 40 MG PO TBEC: 40.0000 mg | DELAYED_RELEASE_TABLET | Freq: Every day | ORAL | 3 refills | Status: DC

## 2017-06-18 NOTE — Telephone Encounter (Signed)
Please refill current pantoprazole Rx x 1 year (# 90 3 RF)

## 2017-06-18 NOTE — Telephone Encounter (Signed)
Called patient, left message that her refill Protonix has been sent to pharmacy listed.

## 2017-06-18 NOTE — Telephone Encounter (Signed)
  Follow up Call-  Call back number 06/17/2017  Post procedure Call Back phone  # [phone]  Permission to leave phone message Yes  Some recent data might be hidden     Patient questions:  Do you have a fever, pain , or abdominal swelling? No. Pain Score  0 *  Have you tolerated food without any problems? Yes.    Have you been able to return to your normal activities? Yes.    Do you have any questions about your discharge instructions: Diet   No. Medications  Yes.   Follow up visit  No.  Do you have questions or concerns about your Care? No.  Actions: * If pain score is 4 or above: No action needed, pain <4. patient reporting mid chest pain 5/10 last evening after eating pasta with tomato sauce. No pain the am.No fever, dyspnea, vomiting of blood and patient is able to swallow. Patient requesting a refill on her Protonix. Note sent to Dr Carlean Purl for refill.

## 2017-06-23 NOTE — Progress Notes (Signed)
Gastritis and no H pylori Stay on PPI see me prn

## 2017-06-23 NOTE — Progress Notes (Signed)
[facility] - no letter or recall  Office - let her know gastritis but no H pylori infection  Please get a sx update re: dysphagia after dilation of esophagus and reg PPI Tx

## 2017-06-26 ENCOUNTER — Telehealth: Payer: Self-pay | Admitting: Internal Medicine

## 2017-06-26 NOTE — Telephone Encounter (Signed)
Spoke with Lattie Haw at [facility] and did a prior authorization for patients pantoprazole for her GERD K21.9 . It has been approved for a year or thru 06/25/17. [facility] and patient informed.

## 2017-06-26 NOTE — Telephone Encounter (Signed)
I have called the pharmacy and got a phone # to call to do a prior authorization [id number] for her pantoprazole. We had not gotten anything from the pharmacy saying it needed a prior authorization done. I called and told [name] that I'll try to work on this today.

## 2017-07-26 DIAGNOSIS — H18413 Arcus senilis, bilateral: Secondary | ICD-10-CM | POA: Diagnosis not present

## 2017-07-26 DIAGNOSIS — H02834 Dermatochalasis of left upper eyelid: Secondary | ICD-10-CM | POA: Diagnosis not present

## 2017-07-26 DIAGNOSIS — H25013 Cortical age-related cataract, bilateral: Secondary | ICD-10-CM | POA: Diagnosis not present

## 2017-07-26 DIAGNOSIS — H2513 Age-related nuclear cataract, bilateral: Secondary | ICD-10-CM | POA: Diagnosis not present

## 2017-07-26 DIAGNOSIS — H25043 Posterior subcapsular polar age-related cataract, bilateral: Secondary | ICD-10-CM | POA: Diagnosis not present

## 2017-09-24 DIAGNOSIS — I2510 Atherosclerotic heart disease of native coronary artery without angina pectoris: Secondary | ICD-10-CM | POA: Diagnosis not present

## 2017-09-24 DIAGNOSIS — Z23 Encounter for immunization: Secondary | ICD-10-CM | POA: Diagnosis not present

## 2017-09-24 DIAGNOSIS — E559 Vitamin D deficiency, unspecified: Secondary | ICD-10-CM | POA: Diagnosis not present

## 2017-09-24 DIAGNOSIS — Z658 Other specified problems related to psychosocial circumstances: Secondary | ICD-10-CM | POA: Diagnosis not present

## 2017-09-24 DIAGNOSIS — Z79899 Other long term (current) drug therapy: Secondary | ICD-10-CM | POA: Diagnosis not present

## 2017-09-24 DIAGNOSIS — F509 Eating disorder, unspecified: Secondary | ICD-10-CM | POA: Diagnosis not present

## 2017-09-24 DIAGNOSIS — F329 Major depressive disorder, single episode, unspecified: Secondary | ICD-10-CM | POA: Diagnosis not present

## 2017-09-24 DIAGNOSIS — L299 Pruritus, unspecified: Secondary | ICD-10-CM | POA: Diagnosis not present

## 2017-09-24 DIAGNOSIS — Z0000 Encounter for general adult medical examination without abnormal findings: Secondary | ICD-10-CM | POA: Diagnosis not present

## 2017-09-24 DIAGNOSIS — I700 Atherosclerosis of aorta: Secondary | ICD-10-CM | POA: Diagnosis not present

## 2017-09-24 DIAGNOSIS — E538 Deficiency of other specified B group vitamins: Secondary | ICD-10-CM | POA: Diagnosis not present

## 2017-09-24 DIAGNOSIS — Z1389 Encounter for screening for other disorder: Secondary | ICD-10-CM | POA: Diagnosis not present

## 2017-09-24 DIAGNOSIS — R12 Heartburn: Secondary | ICD-10-CM | POA: Diagnosis not present

## 2017-09-30 DIAGNOSIS — Z961 Presence of intraocular lens: Secondary | ICD-10-CM | POA: Diagnosis not present

## 2017-09-30 DIAGNOSIS — Z9842 Cataract extraction status, left eye: Secondary | ICD-10-CM | POA: Diagnosis not present

## 2017-09-30 DIAGNOSIS — H5212 Myopia, left eye: Secondary | ICD-10-CM | POA: Diagnosis not present

## 2017-09-30 DIAGNOSIS — H52222 Regular astigmatism, left eye: Secondary | ICD-10-CM | POA: Diagnosis not present

## 2017-09-30 DIAGNOSIS — H2512 Age-related nuclear cataract, left eye: Secondary | ICD-10-CM | POA: Diagnosis not present

## 2017-10-01 DIAGNOSIS — H2511 Age-related nuclear cataract, right eye: Secondary | ICD-10-CM | POA: Diagnosis not present

## 2017-10-21 DIAGNOSIS — Z961 Presence of intraocular lens: Secondary | ICD-10-CM | POA: Diagnosis not present

## 2017-10-21 DIAGNOSIS — H2511 Age-related nuclear cataract, right eye: Secondary | ICD-10-CM | POA: Diagnosis not present

## 2017-10-21 DIAGNOSIS — Z9849 Cataract extraction status, unspecified eye: Secondary | ICD-10-CM | POA: Diagnosis not present

## 2017-11-19 DIAGNOSIS — Z1231 Encounter for screening mammogram for malignant neoplasm of breast: Secondary | ICD-10-CM | POA: Diagnosis not present

## 2017-11-19 DIAGNOSIS — Z01419 Encounter for gynecological examination (general) (routine) without abnormal findings: Secondary | ICD-10-CM | POA: Diagnosis not present

## 2017-11-19 DIAGNOSIS — Z6823 Body mass index (BMI) 23.0-23.9, adult: Secondary | ICD-10-CM | POA: Diagnosis not present

## 2017-11-29 ENCOUNTER — Ambulatory Visit
Admission: RE | Admit: 2017-11-29 | Discharge: 2017-11-29 | Disposition: A | Discharge status: Home / Self Care | Payer: Federal, State, Local not specified - PPO | Source: Ambulatory Visit | Attending: Acute Care | Admitting: Acute Care

## 2017-11-29 DIAGNOSIS — Z87891 Personal history of nicotine dependence: Secondary | ICD-10-CM

## 2017-11-29 DIAGNOSIS — Z122 Encounter for screening for malignant neoplasm of respiratory organs: Secondary | ICD-10-CM

## 2017-12-06 ENCOUNTER — Telehealth: Payer: Self-pay | Admitting: Acute Care

## 2017-12-06 DIAGNOSIS — Z87891 Personal history of nicotine dependence: Secondary | ICD-10-CM

## 2017-12-06 DIAGNOSIS — Z122 Encounter for screening for malignant neoplasm of respiratory organs: Secondary | ICD-10-CM

## 2017-12-06 NOTE — Telephone Encounter (Signed)
LMTC x 1  

## 2017-12-06 NOTE — Telephone Encounter (Signed)
Pt informed of CT results per Sarah Groce, NP.  PT verbalized understanding.  Copy sent to PCP.  Order placed for 1 yr f/u CT.  

## 2017-12-19 DIAGNOSIS — L821 Other seborrheic keratosis: Secondary | ICD-10-CM | POA: Diagnosis not present

## 2017-12-19 DIAGNOSIS — R21 Rash and other nonspecific skin eruption: Secondary | ICD-10-CM | POA: Diagnosis not present

## 2017-12-19 DIAGNOSIS — L814 Other melanin hyperpigmentation: Secondary | ICD-10-CM | POA: Diagnosis not present

## 2017-12-19 DIAGNOSIS — L918 Other hypertrophic disorders of the skin: Secondary | ICD-10-CM | POA: Diagnosis not present

## 2017-12-19 DIAGNOSIS — D1801 Hemangioma of skin and subcutaneous tissue: Secondary | ICD-10-CM | POA: Diagnosis not present

## 2017-12-19 DIAGNOSIS — L720 Epidermal cyst: Secondary | ICD-10-CM | POA: Diagnosis not present

## 2017-12-19 DIAGNOSIS — L920 Granuloma annulare: Secondary | ICD-10-CM | POA: Diagnosis not present

## 2018-01-23 ENCOUNTER — Encounter: Payer: Self-pay | Admitting: Nurse Practitioner

## 2018-01-23 ENCOUNTER — Ambulatory Visit (INDEPENDENT_AMBULATORY_CARE_PROVIDER_SITE_OTHER): Payer: Medicare Other | Admitting: Nurse Practitioner

## 2018-01-23 VITALS — BP 122/74 | HR 68 | Ht 66.0 in | Wt 144.0 lb

## 2018-01-23 DIAGNOSIS — K2960 Other gastritis without bleeding: Secondary | ICD-10-CM | POA: Diagnosis not present

## 2018-01-23 DIAGNOSIS — K219 Gastro-esophageal reflux disease without esophagitis: Secondary | ICD-10-CM

## 2018-01-23 DIAGNOSIS — R1013 Epigastric pain: Secondary | ICD-10-CM | POA: Diagnosis not present

## 2018-01-23 DIAGNOSIS — G8929 Other chronic pain: Secondary | ICD-10-CM | POA: Diagnosis not present

## 2018-01-23 DIAGNOSIS — K625 Hemorrhage of anus and rectum: Secondary | ICD-10-CM

## 2018-01-23 MED ORDER — SUCRALFATE 1 G PO TABS: 1.0000 g | ORAL_TABLET | Freq: Three times a day (TID) | ORAL | 1 refills | Status: DC

## 2018-01-23 NOTE — Progress Notes (Addendum)
Chief Complaint:  Rectal bleeding, burning in stomach and chest    IMPRESSION and PLAN:     33.  73 year old female with chronic intermittent epigastric / chest discomfort / globus.  We saw her in April for globus / dysphagia, she underwent EGD with empirical dilation in June with significant improvement in dysphagia.  The epigastric / chest discomfort still bother her. Feels like a bubble is in her chest at times and epigastrium "just hurts".  -The epigastric / chest discomfort occur together. Actually epigastric pain radiates into her chest. She had gastric erosions on EGD in June ( ? NSAID related). Path >> chronic gastritis, no h.pylori.  She continues to take Aleve several times a week which could be causing her persistent epigastric pain.  Not quite sure how to tie in the chest discomfort however.  -Patient agrees to discontinue Aleve.  She will take Tylenol for headaches instead -Trial of Carafate 1 g before meals and at bedtime -If no improvement with above then perhaps a 24-hour pH study on meds would provide some insight as to whether this is acid related discomfort.   2. Painless rectal bleeding, isolated episode earlier this week. She inquires about possibility of colon cancer. Tried to reassure her. Offered rectal to see if hemorrhoids present vrs preceding earlier with screening colonoscopy due Sept 2021.  -Patient wishes to proceed with colonoscopy for further evaluation of rectal bleeding. The risks and benefits of colonoscopy with possible polypectomy were discussed and the patient agrees to proceed.   Agree with Ms. Cleven Jansma's assessment and plan. Gatha Mayer, MD, [facility]    HPI:     Patient is a 73 yo female followed by Dr. Carlean Purl. She has chronic intermittent globus / chest and epigastric burning. She was seen in April of this years with vague complaints of dysphagia which seemed more like globus. She subsequently underwent EGD with empirical dilation (June).  Exam was normal except for erosive gastropathy. Biopsies c/w with gastritis, no H.pylori.  She still has occasional problems with food sticking but overall much improved since esophageal dilation.  Nneka comes in today with a couple of issues. First, on Monday she had bright red blood in her stool.  No associated rectal pain no unusual abdominal pain.  She was not constipated nor having diarrhea.  No blood in bowel movements since.  Her second issue is that of ongoing epigastric/chest discomfort.  She has had the same discomfort for many years.  The burning starts in the epigastrium and radiates upward into the her chest and jaws.  Feels like she has a bubble in her chest.  She denies regurgitation but complains of excessive mucus production in her throat.  There is no relationship when eating and the pain.  She is a daily PPI but also requires H2 blocker sometimes. She takes Aleve 3-4 times a week to prevent /treat  Headaches, especially ETOH related ones. .  Again, patient reassures me her pain has not changed in any way over the years.  She hasn't had any unusual weight loss.   Review of systems:     No chest pain, no SOB, no fevers, no urinary sx   Past Medical History:  Diagnosis Date  . Allergy   . Anxiety   . Cataract   . Diverticulosis   . Esophageal spasm   . GERD (gastroesophageal reflux disease)   . Helicobacter pylori gastritis   . Osteoporosis     Patient's surgical history,  family medical history, social history, medications and allergies were all reviewed in Epic   Creatinine clearance cannot be calculated (No successful lab value found.)  Current Outpatient Medications  Medication Sig Dispense Refill  . CALCIUM PO Take by mouth 2 (two) times daily.    . cetirizine (ZYRTEC) 10 MG tablet Take 10 mg by mouth every morning.    . clonazePAM (KLONOPIN) 1 MG tablet Takes one tablet by mouth at bedtime    . doxepin (SINEQUAN) 10 MG capsule Take 10 mg by mouth.    . estradiol  (ESTRACE) 1 MG tablet Takes one tablet by mouth once daily    . famotidine (PEPCID) 20 MG tablet Take 20 mg by mouth as needed.     . hydrOXYzine (ATARAX/VISTARIL) 25 MG tablet Take 25 mg by mouth at bedtime.    . Multiple Vitamins-Minerals (MULTIVITAMIN ADULT PO) Take by mouth.    . pantoprazole (PROTONIX) 40 MG tablet Take 1 tablet (40 mg total) by mouth daily before breakfast. 90 tablet 3  . progesterone (PROMETRIUM) 200 MG capsule Take by mouth at bedtime. Every 3 months    . sertraline (ZOLOFT) 100 MG tablet Takes one tablet by mouth once daily    . topiramate (TOPAMAX) 25 MG capsule Take 25 mg by mouth as needed.     No current facility-administered medications for this visit.     Physical Exam:     BP 122/74   Pulse 68   Ht 5\' 6"  (1.676 m)   Wt 144 lb (65.3 kg)   SpO2 98%   BMI 23.24 kg/m   GENERAL:  Pleasant female in NAD PSYCH: : Cooperative, normal affect EENT:  conjunctiva pink, mucous membranes moist, neck supple without masses CARDIAC:  RRR, no murmur heard, no peripheral edema PULM: Normal respiratory effort, lungs CTA bilaterally, no wheezing ABDOMEN:  Nondistended, soft, nontender. No obvious masses, no hepatomegaly,  normal bowel sounds SKIN:  turgor, no lesions seen Musculoskeletal:  Normal muscle tone, normal strength NEURO: Alert and oriented x 3, no focal neurologic deficits   Tye Savoy , NP 01/23/2018, 10:53 AM

## 2018-01-23 NOTE — Patient Instructions (Signed)
If you are age 72 or older, your body mass index should be between 23-30. Your Body mass index is 23.24 kg/m. If this is out of the aforementioned range listed, please consider follow up with your Primary Care Provider.  If you are age 44 or younger, your body mass index should be between 19-25. Your Body mass index is 23.24 kg/m. If this is out of the aformentioned range listed, please consider follow up with your Primary Care Provider.   You have been scheduled for a colonoscopy. Please follow written instructions given to you at your visit today.  Please pick up your prep supplies at the pharmacy within the next 1-3 days. If you use inhalers (even only as needed), please bring them with you on the day of your procedure. Your physician has requested that you go to www.startemmi.com and enter the access code given to you at your visit today. This web site gives a general overview about your procedure. However, you should still follow specific instructions given to you by our office regarding your preparation for the procedure.   We have sent the following medications to your pharmacy for you to pick up at your convenience: Carafate  STOP Ibuprofen.  Take Tylenol as needed for headaches.  Thank you for choosing me and [facility].   Tye Savoy, NP

## 2018-02-11 DIAGNOSIS — M25561 Pain in right knee: Secondary | ICD-10-CM | POA: Diagnosis not present

## 2018-02-18 ENCOUNTER — Ambulatory Visit: Payer: Medicare Other | Admitting: Internal Medicine

## 2018-02-25 ENCOUNTER — Encounter: Payer: Self-pay | Admitting: Internal Medicine

## 2018-02-25 ENCOUNTER — Ambulatory Visit (AMBULATORY_SURGERY_CENTER): Payer: Medicare Other | Admitting: Internal Medicine

## 2018-02-25 VITALS — BP 132/75 | HR 59 | Temp 97.8°F | Resp 14 | Ht 66.0 in | Wt 144.0 lb

## 2018-02-25 DIAGNOSIS — K5730 Diverticulosis of large intestine without perforation or abscess without bleeding: Secondary | ICD-10-CM

## 2018-02-25 DIAGNOSIS — K649 Unspecified hemorrhoids: Secondary | ICD-10-CM

## 2018-02-25 DIAGNOSIS — K625 Hemorrhage of anus and rectum: Secondary | ICD-10-CM | POA: Diagnosis not present

## 2018-02-25 DIAGNOSIS — K2970 Gastritis, unspecified, without bleeding: Secondary | ICD-10-CM | POA: Diagnosis not present

## 2018-02-25 MED ORDER — SODIUM CHLORIDE 0.9 % IV SOLN: 500.0000 mL | Freq: Once | INTRAVENOUS | Status: DC

## 2018-02-25 NOTE — Patient Instructions (Addendum)
No polyps or cancer seen.  You have diverticulosis and hemorrhoids as before. I suspect you have intermittent bleeding from the hemorrhoids.  I am glad the sucralfate has alleviated your upper GI symptoms.  I appreciate the opportunity to care for you. Gatha Mayer, MD, [facility] ]     YOU HAD AN ENDOSCOPIC PROCEDURE TODAY AT [facility]:   Refer to the procedure report that was given to you for any specific questions about what was found during the examination.  If the procedure report does not answer your questions, please call your gastroenterologist to clarify.  If you requested that your care partner not be given the details of your procedure findings, then the procedure report has been included in a sealed envelope for you to review at your convenience later.  YOU SHOULD EXPECT: Some feelings of bloating in the abdomen. Passage of more gas than usual.  Walking can help get rid of the air that was put into your GI tract during the procedure and reduce the bloating. If you had a lower endoscopy (such as a colonoscopy or flexible sigmoidoscopy) you may notice spotting of blood in your stool or on the toilet paper. If you underwent a bowel prep for your procedure, you may not have a normal bowel movement for a few days.  Please Note:  You might notice some irritation and congestion in your nose or some drainage.  This is from the oxygen used during your procedure.  There is no need for concern and it should clear up in a day or so.  SYMPTOMS TO REPORT IMMEDIATELY:   Following lower endoscopy (colonoscopy or flexible sigmoidoscopy):  Excessive amounts of blood in the stool  Significant tenderness or worsening of abdominal pains  Swelling of the abdomen that is new, acute  Fever of 100F or higher   For urgent or emergent issues, a gastroenterologist can be reached at any hour by calling [phone].   DIET:  We do recommend a small meal at first, but then  you may proceed to your regular diet.  Drink plenty of fluids but you should avoid alcoholic beverages for 24 hours.  ACTIVITY:  You should plan to take it easy for the rest of today and you should NOT DRIVE or use heavy machinery until tomorrow (because of the sedation medicines used during the test).    FOLLOW UP: Our staff will call the number listed on your records the next business day following your procedure to check on you and address any questions or concerns that you may have regarding the information given to you following your procedure. If we do not reach you, we will leave a message.  However, if you are feeling well and you are not experiencing any problems, there is no need to return our call.  We will assume that you have returned to your regular daily activities without incident.  If any biopsies were taken you will be contacted by phone or by letter within the next 1-3 weeks.  Please call us at [phone] if you have not heard about the biopsies in 3 weeks.    SIGNATURES/CONFIDENTIALITY: You and/or your care partner have signed paperwork which will be entered into your electronic medical record.  These signatures attest to the fact that that the information above on your After Visit Summary has been reviewed and is understood.  Full responsibility of the confidentiality of this discharge information lies with you and/or your care-partner.  Handouts were given to your care partner on diverticulosis and hemorrhoids. You may resume your current medications today. No repeat colonoscopy due to age and the absence of colon polyps. Please call if any questions or concerns.

## 2018-02-25 NOTE — Op Note (Signed)
[facility] Patient Name: Nicole Mercer Procedure Date: 02/25/2018 1:34 PM MRN: [id number] Endoscopist: Gatha Mayer , MD Age: 73 Referring MD:  Date of Birth: [birth year] Gender: Female Account #: [id number] Procedure:                Colonoscopy Indications:              Rectal bleeding Medicines:                Propofol per Anesthesia, Monitored Anesthesia Care Procedure:                Pre-Anesthesia Assessment:                           - Prior to the procedure, a History and Physical                            was performed, and patient medications and                            allergies were reviewed. The patient's tolerance of                            previous anesthesia was also reviewed. The risks                            and benefits of the procedure and the sedation                            options and risks were discussed with the patient.                            All questions were answered, and informed consent                            was obtained. Prior Anticoagulants: The patient has                            taken no previous anticoagulant or antiplatelet                            agents. ASA Grade Assessment: II - A patient with                            mild systemic disease. After reviewing the risks                            and benefits, the patient was deemed in                            satisfactory condition to undergo the procedure.                           After obtaining informed consent, the colonoscope  was passed under direct vision. Throughout the                            procedure, the patient's blood pressure, pulse, and                            oxygen saturations were monitored continuously. The                            Colonoscope was introduced through the anus and                            advanced to the the cecum, identified by                            appendiceal orifice and  ileocecal valve. The                            colonoscopy was performed without difficulty. The                            patient tolerated the procedure well. The quality                            of the bowel preparation was excellent. The                            ileocecal valve, appendiceal orifice, and rectum                            were photographed. Scope In: 1:40:13 PM Scope Out: 1:53:19 PM Scope Withdrawal Time: 0 hours 7 minutes 57 seconds  Total Procedure Duration: 0 hours 13 minutes 6 seconds  Findings:                 The perianal and digital rectal examinations were                            normal.                           External hemorrhoids were found.                           Multiple diverticula were found in the sigmoid                            colon.                           The exam was otherwise without abnormality on                            direct and retroflexion views. Complications:            No immediate complications. Estimated Blood Loss:     Estimated blood loss: none.  Impression:               - External hemorrhoids.                           - Diverticulosis in the sigmoid colon.                           - The examination was otherwise normal on direct                            and retroflexion views.                           - No specimens collected. Recommendation:           - Patient has a contact number available for                            emergencies. The signs and symptoms of potential                            delayed complications were discussed with the                            patient. Return to normal activities tomorrow.                            Written discharge instructions were provided to the                            patient.                           - Resume previous diet.                           - Continue present medications.                           - No repeat colonoscopy due to age and the  absence                            of colonic polyps. Gatha Mayer, MD 02/25/2018 2:03:02 PM This report has been signed electronically.

## 2018-02-25 NOTE — Progress Notes (Signed)
No problems noted in the recovery room. maw 

## 2018-02-26 ENCOUNTER — Telehealth: Payer: Self-pay

## 2018-02-26 NOTE — Telephone Encounter (Signed)
  Follow up Call-  Call back number 02/25/2018 06/17/2017  Post procedure Call Back phone  # [phone]  Permission to leave phone message Yes Yes  Some recent data might be hidden     Patient questions:  Do you have a fever, pain , or abdominal swelling? No. Pain Score  0 *  Have you tolerated food without any problems? Yes.    Have you been able to return to your normal activities? Yes.    Do you have any questions about your discharge instructions: Diet   No. Medications  No. Follow up visit  No.  Do you have questions or concerns about your Care? No.  Actions: * If pain score is 4 or above: No action needed, pain <4.

## 2018-02-26 NOTE — Telephone Encounter (Signed)
Called [phone] and left a messaged we tried to reach pt for a follow up call. maw

## 2018-02-26 NOTE — Telephone Encounter (Deleted)
  Follow up Call-  Call back number 02/25/2018 06/17/2017  Post procedure Call Back phone  # [phone]  Permission to leave phone message Yes Yes  Some recent data might be hidden     Patient questions:  Do you have a fever, pain , or abdominal swelling? {yes no:314532} Pain Score  {NUMBERS; 0-10:5044} *  Have you tolerated food without any problems? {yes no:314532}  Have you been able to return to your normal activities? {yes no:314532}  Do you have any questions about your discharge instructions: Diet   {yes no:314532} Medications  {yes no:314532} Follow up visit  {yes no:314532}  Do you have questions or concerns about your Care? {yes no:314532}  Actions: * If pain score is 4 or above: {ACTION; [facility] ENDO PAIN >4:21563::"No action needed, pain <4."}

## 2018-04-04 ENCOUNTER — Other Ambulatory Visit: Payer: Self-pay | Admitting: Internal Medicine

## 2018-04-04 DIAGNOSIS — K219 Gastro-esophageal reflux disease without esophagitis: Secondary | ICD-10-CM

## 2018-04-04 DIAGNOSIS — K2960 Other gastritis without bleeding: Secondary | ICD-10-CM

## 2018-04-04 DIAGNOSIS — R1013 Epigastric pain: Secondary | ICD-10-CM

## 2018-04-15 DIAGNOSIS — M79601 Pain in right arm: Secondary | ICD-10-CM | POA: Diagnosis not present

## 2018-04-15 DIAGNOSIS — M25511 Pain in right shoulder: Secondary | ICD-10-CM | POA: Diagnosis not present

## 2018-06-11 DIAGNOSIS — N950 Postmenopausal bleeding: Secondary | ICD-10-CM | POA: Diagnosis not present

## 2018-06-16 DIAGNOSIS — N950 Postmenopausal bleeding: Secondary | ICD-10-CM | POA: Diagnosis not present

## 2018-06-16 DIAGNOSIS — N840 Polyp of corpus uteri: Secondary | ICD-10-CM | POA: Diagnosis not present

## 2018-09-14 ENCOUNTER — Other Ambulatory Visit: Payer: Self-pay | Admitting: Nurse Practitioner

## 2018-09-14 DIAGNOSIS — Z23 Encounter for immunization: Secondary | ICD-10-CM | POA: Diagnosis not present

## 2018-09-15 MED ORDER — SUCRALFATE 1 G PO TABS: 1.0000 g | ORAL_TABLET | Freq: Three times a day (TID) | ORAL | 11 refills | Status: DC

## 2018-09-15 NOTE — Telephone Encounter (Signed)
I agree with Peter [place] refilling it  Can do for 1 year

## 2018-09-15 NOTE — Telephone Encounter (Signed)
Refilled her carafate for a year as approved.

## 2018-09-15 NOTE — Addendum Note (Signed)
Addended by: [place], Jobanny Mavis E on: 09/15/2018 11:43 AM   Modules accepted: Orders

## 2018-09-15 NOTE — Telephone Encounter (Signed)
Please advise Sir, thank you. 

## 2018-09-18 ENCOUNTER — Telehealth: Payer: Self-pay

## 2018-09-18 NOTE — Telephone Encounter (Signed)
Started a [employer] for patients Pantoprazole rx - her current one is about to expire. Dx - GERD K21.9.

## 2018-09-24 NOTE — Telephone Encounter (Signed)
Answered more questions on [employer] and got an approval for this patients pantoprazole.

## 2018-10-14 DIAGNOSIS — I2510 Atherosclerotic heart disease of native coronary artery without angina pectoris: Secondary | ICD-10-CM | POA: Diagnosis not present

## 2018-10-14 DIAGNOSIS — R319 Hematuria, unspecified: Secondary | ICD-10-CM | POA: Diagnosis not present

## 2018-10-14 DIAGNOSIS — M25511 Pain in right shoulder: Secondary | ICD-10-CM | POA: Diagnosis not present

## 2018-10-14 DIAGNOSIS — F509 Eating disorder, unspecified: Secondary | ICD-10-CM | POA: Diagnosis not present

## 2018-10-14 DIAGNOSIS — E559 Vitamin D deficiency, unspecified: Secondary | ICD-10-CM | POA: Diagnosis not present

## 2018-10-14 DIAGNOSIS — I700 Atherosclerosis of aorta: Secondary | ICD-10-CM | POA: Diagnosis not present

## 2018-10-14 DIAGNOSIS — Z1389 Encounter for screening for other disorder: Secondary | ICD-10-CM | POA: Diagnosis not present

## 2018-10-14 DIAGNOSIS — Z658 Other specified problems related to psychosocial circumstances: Secondary | ICD-10-CM | POA: Diagnosis not present

## 2018-10-14 DIAGNOSIS — R12 Heartburn: Secondary | ICD-10-CM | POA: Diagnosis not present

## 2018-10-14 DIAGNOSIS — L299 Pruritus, unspecified: Secondary | ICD-10-CM | POA: Diagnosis not present

## 2018-10-14 DIAGNOSIS — J449 Chronic obstructive pulmonary disease, unspecified: Secondary | ICD-10-CM | POA: Diagnosis not present

## 2018-10-14 DIAGNOSIS — E538 Deficiency of other specified B group vitamins: Secondary | ICD-10-CM | POA: Diagnosis not present

## 2018-10-14 DIAGNOSIS — Z79899 Other long term (current) drug therapy: Secondary | ICD-10-CM | POA: Diagnosis not present

## 2018-10-14 DIAGNOSIS — Z0000 Encounter for general adult medical examination without abnormal findings: Secondary | ICD-10-CM | POA: Diagnosis not present

## 2018-10-20 DIAGNOSIS — M25511 Pain in right shoulder: Secondary | ICD-10-CM | POA: Diagnosis not present

## 2018-10-20 DIAGNOSIS — M75121 Complete rotator cuff tear or rupture of right shoulder, not specified as traumatic: Secondary | ICD-10-CM | POA: Diagnosis not present

## 2018-10-29 DIAGNOSIS — S46011A Strain of muscle(s) and tendon(s) of the rotator cuff of right shoulder, initial encounter: Secondary | ICD-10-CM | POA: Diagnosis not present

## 2018-10-29 DIAGNOSIS — M75121 Complete rotator cuff tear or rupture of right shoulder, not specified as traumatic: Secondary | ICD-10-CM | POA: Diagnosis not present

## 2018-11-06 DIAGNOSIS — G8918 Other acute postprocedural pain: Secondary | ICD-10-CM | POA: Diagnosis not present

## 2018-11-06 DIAGNOSIS — M7551 Bursitis of right shoulder: Secondary | ICD-10-CM | POA: Diagnosis not present

## 2018-11-06 DIAGNOSIS — M659 Synovitis and tenosynovitis, unspecified: Secondary | ICD-10-CM | POA: Diagnosis not present

## 2018-11-06 DIAGNOSIS — M7521 Bicipital tendinitis, right shoulder: Secondary | ICD-10-CM | POA: Diagnosis not present

## 2018-11-06 DIAGNOSIS — M75121 Complete rotator cuff tear or rupture of right shoulder, not specified as traumatic: Secondary | ICD-10-CM | POA: Diagnosis not present

## 2018-11-06 DIAGNOSIS — M7541 Impingement syndrome of right shoulder: Secondary | ICD-10-CM | POA: Diagnosis not present

## 2018-11-14 DIAGNOSIS — M75121 Complete rotator cuff tear or rupture of right shoulder, not specified as traumatic: Secondary | ICD-10-CM | POA: Diagnosis not present

## 2018-11-17 DIAGNOSIS — M75121 Complete rotator cuff tear or rupture of right shoulder, not specified as traumatic: Secondary | ICD-10-CM | POA: Diagnosis not present

## 2018-11-19 DIAGNOSIS — M75121 Complete rotator cuff tear or rupture of right shoulder, not specified as traumatic: Secondary | ICD-10-CM | POA: Diagnosis not present

## 2018-11-24 DIAGNOSIS — M75121 Complete rotator cuff tear or rupture of right shoulder, not specified as traumatic: Secondary | ICD-10-CM | POA: Diagnosis not present

## 2018-11-26 DIAGNOSIS — M75121 Complete rotator cuff tear or rupture of right shoulder, not specified as traumatic: Secondary | ICD-10-CM | POA: Diagnosis not present

## 2018-12-01 ENCOUNTER — Ambulatory Visit
Admission: RE | Admit: 2018-12-01 | Discharge: 2018-12-01 | Disposition: A | Discharge status: Home / Self Care | Payer: Medicare Other | Source: Ambulatory Visit | Attending: Acute Care | Admitting: Acute Care

## 2018-12-01 DIAGNOSIS — Z87891 Personal history of nicotine dependence: Secondary | ICD-10-CM

## 2018-12-01 DIAGNOSIS — M75121 Complete rotator cuff tear or rupture of right shoulder, not specified as traumatic: Secondary | ICD-10-CM | POA: Diagnosis not present

## 2018-12-01 DIAGNOSIS — Z122 Encounter for screening for malignant neoplasm of respiratory organs: Secondary | ICD-10-CM

## 2018-12-04 DIAGNOSIS — M75121 Complete rotator cuff tear or rupture of right shoulder, not specified as traumatic: Secondary | ICD-10-CM | POA: Diagnosis not present

## 2018-12-08 DIAGNOSIS — M75121 Complete rotator cuff tear or rupture of right shoulder, not specified as traumatic: Secondary | ICD-10-CM | POA: Diagnosis not present

## 2018-12-09 DIAGNOSIS — Z124 Encounter for screening for malignant neoplasm of cervix: Secondary | ICD-10-CM | POA: Diagnosis not present

## 2018-12-09 DIAGNOSIS — Z6822 Body mass index (BMI) 22.0-22.9, adult: Secondary | ICD-10-CM | POA: Diagnosis not present

## 2018-12-09 DIAGNOSIS — Z1231 Encounter for screening mammogram for malignant neoplasm of breast: Secondary | ICD-10-CM | POA: Diagnosis not present

## 2018-12-09 DIAGNOSIS — N958 Other specified menopausal and perimenopausal disorders: Secondary | ICD-10-CM | POA: Diagnosis not present

## 2018-12-10 DIAGNOSIS — M75121 Complete rotator cuff tear or rupture of right shoulder, not specified as traumatic: Secondary | ICD-10-CM | POA: Diagnosis not present

## 2018-12-15 ENCOUNTER — Other Ambulatory Visit: Payer: Self-pay | Admitting: *Deleted

## 2018-12-15 DIAGNOSIS — Z87891 Personal history of nicotine dependence: Secondary | ICD-10-CM

## 2018-12-15 DIAGNOSIS — Z122 Encounter for screening for malignant neoplasm of respiratory organs: Secondary | ICD-10-CM

## 2018-12-15 DIAGNOSIS — M75121 Complete rotator cuff tear or rupture of right shoulder, not specified as traumatic: Secondary | ICD-10-CM | POA: Diagnosis not present

## 2018-12-18 DIAGNOSIS — M75121 Complete rotator cuff tear or rupture of right shoulder, not specified as traumatic: Secondary | ICD-10-CM | POA: Diagnosis not present

## 2018-12-22 DIAGNOSIS — M75121 Complete rotator cuff tear or rupture of right shoulder, not specified as traumatic: Secondary | ICD-10-CM | POA: Diagnosis not present

## 2019-01-16 HISTORY — PX: ROTATOR CUFF REPAIR: SHX139

## 2019-01-18 DIAGNOSIS — S91301A Unspecified open wound, right foot, initial encounter: Secondary | ICD-10-CM | POA: Diagnosis not present

## 2019-01-19 DIAGNOSIS — M75121 Complete rotator cuff tear or rupture of right shoulder, not specified as traumatic: Secondary | ICD-10-CM | POA: Diagnosis not present

## 2019-01-29 DIAGNOSIS — M75121 Complete rotator cuff tear or rupture of right shoulder, not specified as traumatic: Secondary | ICD-10-CM | POA: Diagnosis not present

## 2019-02-02 DIAGNOSIS — M75121 Complete rotator cuff tear or rupture of right shoulder, not specified as traumatic: Secondary | ICD-10-CM | POA: Diagnosis not present

## 2019-02-03 DIAGNOSIS — H43813 Vitreous degeneration, bilateral: Secondary | ICD-10-CM | POA: Diagnosis not present

## 2019-02-04 DIAGNOSIS — M75121 Complete rotator cuff tear or rupture of right shoulder, not specified as traumatic: Secondary | ICD-10-CM | POA: Diagnosis not present

## 2019-02-10 DIAGNOSIS — M75121 Complete rotator cuff tear or rupture of right shoulder, not specified as traumatic: Secondary | ICD-10-CM | POA: Diagnosis not present

## 2019-02-12 DIAGNOSIS — M75121 Complete rotator cuff tear or rupture of right shoulder, not specified as traumatic: Secondary | ICD-10-CM | POA: Diagnosis not present

## 2019-02-14 DIAGNOSIS — Z23 Encounter for immunization: Secondary | ICD-10-CM | POA: Diagnosis not present

## 2019-02-18 DIAGNOSIS — M75121 Complete rotator cuff tear or rupture of right shoulder, not specified as traumatic: Secondary | ICD-10-CM | POA: Diagnosis not present

## 2019-02-23 ENCOUNTER — Ambulatory Visit: Payer: Medicare Other

## 2019-02-23 DIAGNOSIS — M75121 Complete rotator cuff tear or rupture of right shoulder, not specified as traumatic: Secondary | ICD-10-CM | POA: Diagnosis not present

## 2019-02-25 DIAGNOSIS — M75121 Complete rotator cuff tear or rupture of right shoulder, not specified as traumatic: Secondary | ICD-10-CM | POA: Diagnosis not present

## 2019-03-03 DIAGNOSIS — M75121 Complete rotator cuff tear or rupture of right shoulder, not specified as traumatic: Secondary | ICD-10-CM | POA: Diagnosis not present

## 2019-03-06 DIAGNOSIS — M75121 Complete rotator cuff tear or rupture of right shoulder, not specified as traumatic: Secondary | ICD-10-CM | POA: Diagnosis not present

## 2019-03-06 DIAGNOSIS — Z09 Encounter for follow-up examination after completed treatment for conditions other than malignant neoplasm: Secondary | ICD-10-CM | POA: Diagnosis not present

## 2019-03-09 DIAGNOSIS — M75121 Complete rotator cuff tear or rupture of right shoulder, not specified as traumatic: Secondary | ICD-10-CM | POA: Diagnosis not present

## 2019-03-11 DIAGNOSIS — M75121 Complete rotator cuff tear or rupture of right shoulder, not specified as traumatic: Secondary | ICD-10-CM | POA: Diagnosis not present

## 2019-03-12 ENCOUNTER — Ambulatory Visit: Payer: Medicare Other

## 2019-03-12 DIAGNOSIS — H26491 Other secondary cataract, right eye: Secondary | ICD-10-CM | POA: Diagnosis not present

## 2019-03-12 DIAGNOSIS — Z961 Presence of intraocular lens: Secondary | ICD-10-CM | POA: Diagnosis not present

## 2019-03-12 DIAGNOSIS — H26492 Other secondary cataract, left eye: Secondary | ICD-10-CM | POA: Diagnosis not present

## 2019-03-12 DIAGNOSIS — H18413 Arcus senilis, bilateral: Secondary | ICD-10-CM | POA: Diagnosis not present

## 2019-03-14 DIAGNOSIS — Z23 Encounter for immunization: Secondary | ICD-10-CM | POA: Diagnosis not present

## 2019-03-16 DIAGNOSIS — M75121 Complete rotator cuff tear or rupture of right shoulder, not specified as traumatic: Secondary | ICD-10-CM | POA: Diagnosis not present

## 2019-03-18 DIAGNOSIS — M75121 Complete rotator cuff tear or rupture of right shoulder, not specified as traumatic: Secondary | ICD-10-CM | POA: Diagnosis not present

## 2019-03-19 DIAGNOSIS — Z9849 Cataract extraction status, unspecified eye: Secondary | ICD-10-CM | POA: Diagnosis not present

## 2019-03-20 DIAGNOSIS — D225 Melanocytic nevi of trunk: Secondary | ICD-10-CM | POA: Diagnosis not present

## 2019-03-20 DIAGNOSIS — L720 Epidermal cyst: Secondary | ICD-10-CM | POA: Diagnosis not present

## 2019-03-20 DIAGNOSIS — L814 Other melanin hyperpigmentation: Secondary | ICD-10-CM | POA: Diagnosis not present

## 2019-03-20 DIAGNOSIS — L438 Other lichen planus: Secondary | ICD-10-CM | POA: Diagnosis not present

## 2019-03-20 DIAGNOSIS — D1801 Hemangioma of skin and subcutaneous tissue: Secondary | ICD-10-CM | POA: Diagnosis not present

## 2019-03-20 DIAGNOSIS — I788 Other diseases of capillaries: Secondary | ICD-10-CM | POA: Diagnosis not present

## 2019-03-20 DIAGNOSIS — L821 Other seborrheic keratosis: Secondary | ICD-10-CM | POA: Diagnosis not present

## 2019-03-23 DIAGNOSIS — M75121 Complete rotator cuff tear or rupture of right shoulder, not specified as traumatic: Secondary | ICD-10-CM | POA: Diagnosis not present

## 2019-03-25 DIAGNOSIS — M75121 Complete rotator cuff tear or rupture of right shoulder, not specified as traumatic: Secondary | ICD-10-CM | POA: Diagnosis not present

## 2019-03-30 DIAGNOSIS — M75121 Complete rotator cuff tear or rupture of right shoulder, not specified as traumatic: Secondary | ICD-10-CM | POA: Diagnosis not present

## 2019-03-31 DIAGNOSIS — H26491 Other secondary cataract, right eye: Secondary | ICD-10-CM | POA: Diagnosis not present

## 2019-04-01 DIAGNOSIS — M75121 Complete rotator cuff tear or rupture of right shoulder, not specified as traumatic: Secondary | ICD-10-CM | POA: Diagnosis not present

## 2019-04-06 DIAGNOSIS — M75121 Complete rotator cuff tear or rupture of right shoulder, not specified as traumatic: Secondary | ICD-10-CM | POA: Diagnosis not present

## 2019-04-08 DIAGNOSIS — Z9849 Cataract extraction status, unspecified eye: Secondary | ICD-10-CM | POA: Diagnosis not present

## 2019-04-09 DIAGNOSIS — M75121 Complete rotator cuff tear or rupture of right shoulder, not specified as traumatic: Secondary | ICD-10-CM | POA: Diagnosis not present

## 2019-04-13 DIAGNOSIS — M75121 Complete rotator cuff tear or rupture of right shoulder, not specified as traumatic: Secondary | ICD-10-CM | POA: Diagnosis not present

## 2019-04-29 ENCOUNTER — Other Ambulatory Visit: Payer: Self-pay | Admitting: Internal Medicine

## 2019-04-29 DIAGNOSIS — R1013 Epigastric pain: Secondary | ICD-10-CM

## 2019-04-29 DIAGNOSIS — K219 Gastro-esophageal reflux disease without esophagitis: Secondary | ICD-10-CM

## 2019-04-29 DIAGNOSIS — K2960 Other gastritis without bleeding: Secondary | ICD-10-CM

## 2019-05-21 ENCOUNTER — Other Ambulatory Visit: Payer: Self-pay | Admitting: Internal Medicine

## 2019-05-21 DIAGNOSIS — J449 Chronic obstructive pulmonary disease, unspecified: Secondary | ICD-10-CM | POA: Diagnosis not present

## 2019-05-21 DIAGNOSIS — K219 Gastro-esophageal reflux disease without esophagitis: Secondary | ICD-10-CM

## 2019-05-21 DIAGNOSIS — F329 Major depressive disorder, single episode, unspecified: Secondary | ICD-10-CM | POA: Diagnosis not present

## 2019-05-21 DIAGNOSIS — F322 Major depressive disorder, single episode, severe without psychotic features: Secondary | ICD-10-CM | POA: Diagnosis not present

## 2019-05-21 DIAGNOSIS — I2510 Atherosclerotic heart disease of native coronary artery without angina pectoris: Secondary | ICD-10-CM | POA: Diagnosis not present

## 2019-05-21 DIAGNOSIS — R1013 Epigastric pain: Secondary | ICD-10-CM

## 2019-05-21 DIAGNOSIS — K2960 Other gastritis without bleeding: Secondary | ICD-10-CM

## 2019-06-13 ENCOUNTER — Other Ambulatory Visit: Payer: Self-pay | Admitting: Internal Medicine

## 2019-06-13 DIAGNOSIS — K219 Gastro-esophageal reflux disease without esophagitis: Secondary | ICD-10-CM

## 2019-06-13 DIAGNOSIS — K2960 Other gastritis without bleeding: Secondary | ICD-10-CM

## 2019-06-13 DIAGNOSIS — R1013 Epigastric pain: Secondary | ICD-10-CM

## 2019-07-13 DIAGNOSIS — J449 Chronic obstructive pulmonary disease, unspecified: Secondary | ICD-10-CM | POA: Diagnosis not present

## 2019-07-13 DIAGNOSIS — F322 Major depressive disorder, single episode, severe without psychotic features: Secondary | ICD-10-CM | POA: Diagnosis not present

## 2019-07-13 DIAGNOSIS — I2510 Atherosclerotic heart disease of native coronary artery without angina pectoris: Secondary | ICD-10-CM | POA: Diagnosis not present

## 2019-07-13 DIAGNOSIS — F329 Major depressive disorder, single episode, unspecified: Secondary | ICD-10-CM | POA: Diagnosis not present

## 2019-08-19 ENCOUNTER — Ambulatory Visit (INDEPENDENT_AMBULATORY_CARE_PROVIDER_SITE_OTHER): Payer: Medicare Other | Admitting: Internal Medicine

## 2019-08-19 ENCOUNTER — Encounter: Payer: Self-pay | Admitting: Internal Medicine

## 2019-08-19 VITALS — BP 126/68 | HR 70 | Ht 66.0 in | Wt 140.0 lb

## 2019-08-19 DIAGNOSIS — R0989 Other specified symptoms and signs involving the circulatory and respiratory systems: Secondary | ICD-10-CM

## 2019-08-19 DIAGNOSIS — R1013 Epigastric pain: Secondary | ICD-10-CM | POA: Diagnosis not present

## 2019-08-19 NOTE — Progress Notes (Signed)
[name] 74 y.o. 1945/09/11 [id number]  Assessment & Plan:   Encounter Diagnoses  Name Primary?   Globus sensation Yes   Dyspepsia     I think it is fine to stay off of PPI based upon her overall clinical scenario.  We do not have a strong indication for it.  She can continue the sucralfate.  Regarding her concerns about her weight etc. she really has had a healthy weight and BMI.  I did review some healthy eating habits that I recommend including the use of olive and avocado oil of duct that the cocaine, and reviewed avoiding processed foods particularly carbohydrates, lower carbohydrate diet to include healthy aboveground vegetables and less starches, and to moderate fruit and explained how added sugar to her diet is typically the most common cause of weight gain.  However, I did reassure her that she is at a healthy weight.  Mediterranean diet information provided as well.   I will see her as needed I appreciate the opportunity to care for this patient. CC: Josetta Huddle, MD   Subjective:   Chief Complaint: Reflux disease globus and dysphagia question stop PPI HPI Nicole Mercer is a 74 year old woman who was started on PPI number of years ago when she had some erosive gastritis she was having some dysphagia and globus sensation.  She reports that she really only has the globus and mild dysphagia when she is stressed or anxious and she would like to come off of the PPI which she is on, pantoprazole.  She is actually run out of it due to a need for prior authorization and has no change in symptoms.  She does take sucralfate about twice a day which seems to relieve some dyspepsia symptoms.  Only uses acetaminophen now no NSAIDs.  She is concerned about changes in her body shape and weight increase. Allergies  Allergen Reactions   Epinephrine     Rapid heart rate   Current Meds  Medication Sig   CALCIUM PO Take by mouth 2 (two) times daily.   cetirizine (ZYRTEC) 10 MG tablet  Take 10 mg by mouth every morning.   clonazePAM (KLONOPIN) 1 MG tablet Takes one tablet by mouth at bedtime   doxepin (SINEQUAN) 10 MG capsule Take 10 mg by mouth.   estradiol (ESTRACE) 1 MG tablet Takes one tablet by mouth once daily   hydrOXYzine (ATARAX/VISTARIL) 25 MG tablet Take 25 mg by mouth at bedtime.   levocetirizine (XYZAL) 5 MG tablet Take 5 mg by mouth every evening.   Multiple Vitamins-Minerals (MULTIVITAMIN ADULT PO) Take by mouth.   pantoprazole (PROTONIX) 40 MG tablet TAKE 1 TABLET (40 MG TOTAL) BY MOUTH DAILY. PLEASE SCHEDULE A YEARLY FOLLOW UP FOR FURTHER REFILLS.   progesterone (PROMETRIUM) 200 MG capsule Take by mouth at bedtime. Every 3 months   sertraline (ZOLOFT) 100 MG tablet Takes one tablet by mouth once daily   sucralfate (CARAFATE) 1 g tablet Take 1 tablet (1 g total) by mouth 4 (four) times daily -  with meals and at bedtime.   topiramate (TOPAMAX) 25 MG capsule Take 25 mg by mouth as needed.   Past Medical History:  Diagnosis Date   Allergy    Anxiety    Cataract    Diverticulosis    Esophageal spasm    GERD (gastroesophageal reflux disease)    Helicobacter pylori gastritis    Osteoporosis    Past Surgical History:  Procedure Laterality Date   COLONOSCOPY  ESOPHAGOGASTRODUODENOSCOPY     SIGMOIDOSCOPY     UPPER GASTROINTESTINAL ENDOSCOPY     Social History   Social History Narrative   She is retired from [employer] system, she works part-time at the [employer].  2 sons one daughter   Divorced   Daily caffeine    family history includes Brain cancer in her sister; Colon polyps in her mother; Diabetes in her mother and sister; Prostate cancer in her father.   Review of Systems As per HPI  Objective:   Physical Exam BP 126/68    Pulse 70    Ht 5\' 6"  (1.676 m)    Wt 140 lb (63.5 kg)    BMI 22.60 kg/m    Total time 22 minutes

## 2019-08-19 NOTE — Patient Instructions (Signed)
Stop pantoprazole as we discussed.   Regarding healthy eating - a mediterranean diet is good.  Olive oil, avocado oil and duck fat for cooking.  Berries for fruit.  Fresh vegetables - ones that grow above ground are best (less potatoes)  Healthy fats, meats ok - no preservatives   I appreciate the opportunity to care for you. Gatha Mayer, MD, [facility]

## 2019-08-30 DIAGNOSIS — Z20822 Contact with and (suspected) exposure to covid-19: Secondary | ICD-10-CM | POA: Diagnosis not present

## 2019-08-30 DIAGNOSIS — Z03818 Encounter for observation for suspected exposure to other biological agents ruled out: Secondary | ICD-10-CM | POA: Diagnosis not present

## 2019-09-10 DIAGNOSIS — J449 Chronic obstructive pulmonary disease, unspecified: Secondary | ICD-10-CM | POA: Diagnosis not present

## 2019-09-10 DIAGNOSIS — F322 Major depressive disorder, single episode, severe without psychotic features: Secondary | ICD-10-CM | POA: Diagnosis not present

## 2019-09-10 DIAGNOSIS — F329 Major depressive disorder, single episode, unspecified: Secondary | ICD-10-CM | POA: Diagnosis not present

## 2019-09-10 DIAGNOSIS — I2510 Atherosclerotic heart disease of native coronary artery without angina pectoris: Secondary | ICD-10-CM | POA: Diagnosis not present

## 2019-09-17 ENCOUNTER — Other Ambulatory Visit: Payer: Self-pay | Admitting: Internal Medicine

## 2019-09-17 DIAGNOSIS — Z79899 Other long term (current) drug therapy: Secondary | ICD-10-CM | POA: Diagnosis not present

## 2019-09-17 DIAGNOSIS — Z720 Tobacco use: Secondary | ICD-10-CM | POA: Diagnosis not present

## 2019-09-17 DIAGNOSIS — K219 Gastro-esophageal reflux disease without esophagitis: Secondary | ICD-10-CM | POA: Diagnosis not present

## 2019-09-17 DIAGNOSIS — J029 Acute pharyngitis, unspecified: Secondary | ICD-10-CM | POA: Diagnosis not present

## 2019-09-17 DIAGNOSIS — R5383 Other fatigue: Secondary | ICD-10-CM | POA: Diagnosis not present

## 2019-09-17 DIAGNOSIS — I700 Atherosclerosis of aorta: Secondary | ICD-10-CM | POA: Diagnosis not present

## 2019-10-02 ENCOUNTER — Ambulatory Visit
Admission: RE | Admit: 2019-10-02 | Discharge: 2019-10-02 | Disposition: A | Discharge status: Home / Self Care | Payer: No Typology Code available for payment source | Source: Ambulatory Visit | Attending: Internal Medicine | Admitting: Internal Medicine

## 2019-10-02 DIAGNOSIS — R911 Solitary pulmonary nodule: Secondary | ICD-10-CM | POA: Diagnosis not present

## 2019-10-02 DIAGNOSIS — Z720 Tobacco use: Secondary | ICD-10-CM

## 2019-10-02 DIAGNOSIS — I700 Atherosclerosis of aorta: Secondary | ICD-10-CM

## 2019-10-02 DIAGNOSIS — R5383 Other fatigue: Secondary | ICD-10-CM

## 2019-10-14 DIAGNOSIS — Z23 Encounter for immunization: Secondary | ICD-10-CM | POA: Diagnosis not present

## 2019-10-22 DIAGNOSIS — R5383 Other fatigue: Secondary | ICD-10-CM | POA: Diagnosis not present

## 2019-10-22 DIAGNOSIS — I700 Atherosclerosis of aorta: Secondary | ICD-10-CM | POA: Diagnosis not present

## 2019-10-22 DIAGNOSIS — J029 Acute pharyngitis, unspecified: Secondary | ICD-10-CM | POA: Diagnosis not present

## 2019-10-22 DIAGNOSIS — Z0000 Encounter for general adult medical examination without abnormal findings: Secondary | ICD-10-CM | POA: Diagnosis not present

## 2019-10-22 DIAGNOSIS — J449 Chronic obstructive pulmonary disease, unspecified: Secondary | ICD-10-CM | POA: Diagnosis not present

## 2019-10-22 DIAGNOSIS — K219 Gastro-esophageal reflux disease without esophagitis: Secondary | ICD-10-CM | POA: Diagnosis not present

## 2019-10-22 DIAGNOSIS — Z720 Tobacco use: Secondary | ICD-10-CM | POA: Diagnosis not present

## 2019-10-22 DIAGNOSIS — F322 Major depressive disorder, single episode, severe without psychotic features: Secondary | ICD-10-CM | POA: Diagnosis not present

## 2019-12-04 ENCOUNTER — Ambulatory Visit
Admission: RE | Admit: 2019-12-04 | Discharge: 2019-12-04 | Disposition: A | Discharge status: Home / Self Care | Payer: Federal, State, Local not specified - PPO | Source: Ambulatory Visit | Attending: Acute Care | Admitting: Acute Care

## 2019-12-04 DIAGNOSIS — Z122 Encounter for screening for malignant neoplasm of respiratory organs: Secondary | ICD-10-CM

## 2019-12-04 DIAGNOSIS — J432 Centrilobular emphysema: Secondary | ICD-10-CM | POA: Diagnosis not present

## 2019-12-04 DIAGNOSIS — Z87891 Personal history of nicotine dependence: Secondary | ICD-10-CM | POA: Diagnosis not present

## 2019-12-04 DIAGNOSIS — I700 Atherosclerosis of aorta: Secondary | ICD-10-CM | POA: Diagnosis not present

## 2019-12-04 DIAGNOSIS — R590 Localized enlarged lymph nodes: Secondary | ICD-10-CM | POA: Diagnosis not present

## 2019-12-08 NOTE — Progress Notes (Signed)
Please call patient and let them  know their  low dose Ct was read as a Lung RADS 2: nodules that are benign in appearance and behavior with a very low likelihood of becoming a clinically active cancer due to size or lack of growth. Recommendation per radiology is for a repeat LDCT in 12 months. .Please let them  know we will order and schedule their  annual screening scan for 11/2020. Please let them  know there was notation of CAD on their  scan.  Please remind the patient  that this is a non-gated exam therefore degree or severity of disease  cannot be determined. Please have them  follow up with their PCP regarding potential risk factor modification, dietary therapy or pharmacologic therapy if clinically indicated. Pt.  is not  currently on statin therapy. Please place order for annual  screening scan for  11/2020 and fax results to PCP. Thanks so much. 

## 2019-12-09 ENCOUNTER — Other Ambulatory Visit: Payer: Self-pay | Admitting: *Deleted

## 2019-12-09 DIAGNOSIS — Z87891 Personal history of nicotine dependence: Secondary | ICD-10-CM

## 2019-12-14 DIAGNOSIS — Z1231 Encounter for screening mammogram for malignant neoplasm of breast: Secondary | ICD-10-CM | POA: Diagnosis not present

## 2019-12-14 DIAGNOSIS — N950 Postmenopausal bleeding: Secondary | ICD-10-CM | POA: Diagnosis not present

## 2019-12-14 DIAGNOSIS — Z6822 Body mass index (BMI) 22.0-22.9, adult: Secondary | ICD-10-CM | POA: Diagnosis not present

## 2019-12-14 DIAGNOSIS — Z01419 Encounter for gynecological examination (general) (routine) without abnormal findings: Secondary | ICD-10-CM | POA: Diagnosis not present

## 2019-12-21 DIAGNOSIS — J0100 Acute maxillary sinusitis, unspecified: Secondary | ICD-10-CM | POA: Diagnosis not present

## 2020-01-04 ENCOUNTER — Other Ambulatory Visit: Payer: Self-pay | Admitting: Internal Medicine

## 2020-02-11 DIAGNOSIS — I2510 Atherosclerotic heart disease of native coronary artery without angina pectoris: Secondary | ICD-10-CM | POA: Diagnosis not present

## 2020-02-11 DIAGNOSIS — J449 Chronic obstructive pulmonary disease, unspecified: Secondary | ICD-10-CM | POA: Diagnosis not present

## 2020-02-11 DIAGNOSIS — F322 Major depressive disorder, single episode, severe without psychotic features: Secondary | ICD-10-CM | POA: Diagnosis not present

## 2020-02-11 DIAGNOSIS — K219 Gastro-esophageal reflux disease without esophagitis: Secondary | ICD-10-CM | POA: Diagnosis not present

## 2020-02-11 DIAGNOSIS — G4700 Insomnia, unspecified: Secondary | ICD-10-CM | POA: Diagnosis not present

## 2020-02-11 DIAGNOSIS — F329 Major depressive disorder, single episode, unspecified: Secondary | ICD-10-CM | POA: Diagnosis not present

## 2020-03-14 DIAGNOSIS — H43813 Vitreous degeneration, bilateral: Secondary | ICD-10-CM | POA: Diagnosis not present

## 2020-03-23 DIAGNOSIS — L570 Actinic keratosis: Secondary | ICD-10-CM | POA: Diagnosis not present

## 2020-03-23 DIAGNOSIS — I788 Other diseases of capillaries: Secondary | ICD-10-CM | POA: Diagnosis not present

## 2020-03-23 DIAGNOSIS — L821 Other seborrheic keratosis: Secondary | ICD-10-CM | POA: Diagnosis not present

## 2020-03-23 DIAGNOSIS — L814 Other melanin hyperpigmentation: Secondary | ICD-10-CM | POA: Diagnosis not present

## 2020-03-23 DIAGNOSIS — D485 Neoplasm of uncertain behavior of skin: Secondary | ICD-10-CM | POA: Diagnosis not present

## 2020-03-23 DIAGNOSIS — D1801 Hemangioma of skin and subcutaneous tissue: Secondary | ICD-10-CM | POA: Diagnosis not present

## 2020-04-06 DIAGNOSIS — I2510 Atherosclerotic heart disease of native coronary artery without angina pectoris: Secondary | ICD-10-CM | POA: Diagnosis not present

## 2020-04-06 DIAGNOSIS — K219 Gastro-esophageal reflux disease without esophagitis: Secondary | ICD-10-CM | POA: Diagnosis not present

## 2020-04-06 DIAGNOSIS — J449 Chronic obstructive pulmonary disease, unspecified: Secondary | ICD-10-CM | POA: Diagnosis not present

## 2020-04-06 DIAGNOSIS — G4700 Insomnia, unspecified: Secondary | ICD-10-CM | POA: Diagnosis not present

## 2020-04-06 DIAGNOSIS — F329 Major depressive disorder, single episode, unspecified: Secondary | ICD-10-CM | POA: Diagnosis not present

## 2020-06-02 DIAGNOSIS — Z23 Encounter for immunization: Secondary | ICD-10-CM | POA: Diagnosis not present

## 2020-06-07 DIAGNOSIS — I2510 Atherosclerotic heart disease of native coronary artery without angina pectoris: Secondary | ICD-10-CM | POA: Diagnosis not present

## 2020-06-07 DIAGNOSIS — K219 Gastro-esophageal reflux disease without esophagitis: Secondary | ICD-10-CM | POA: Diagnosis not present

## 2020-06-07 DIAGNOSIS — G4700 Insomnia, unspecified: Secondary | ICD-10-CM | POA: Diagnosis not present

## 2020-06-07 DIAGNOSIS — J449 Chronic obstructive pulmonary disease, unspecified: Secondary | ICD-10-CM | POA: Diagnosis not present

## 2020-06-07 DIAGNOSIS — F329 Major depressive disorder, single episode, unspecified: Secondary | ICD-10-CM | POA: Diagnosis not present

## 2020-07-05 DIAGNOSIS — I2510 Atherosclerotic heart disease of native coronary artery without angina pectoris: Secondary | ICD-10-CM | POA: Diagnosis not present

## 2020-07-05 DIAGNOSIS — K219 Gastro-esophageal reflux disease without esophagitis: Secondary | ICD-10-CM | POA: Diagnosis not present

## 2020-07-05 DIAGNOSIS — J449 Chronic obstructive pulmonary disease, unspecified: Secondary | ICD-10-CM | POA: Diagnosis not present

## 2020-07-05 DIAGNOSIS — G4700 Insomnia, unspecified: Secondary | ICD-10-CM | POA: Diagnosis not present

## 2020-07-05 DIAGNOSIS — F322 Major depressive disorder, single episode, severe without psychotic features: Secondary | ICD-10-CM | POA: Diagnosis not present

## 2020-08-24 ENCOUNTER — Ambulatory Visit: Payer: Medicare Other | Admitting: Orthopaedic Surgery

## 2020-08-29 ENCOUNTER — Ambulatory Visit (INDEPENDENT_AMBULATORY_CARE_PROVIDER_SITE_OTHER): Payer: Medicare Other

## 2020-08-29 ENCOUNTER — Ambulatory Visit: Payer: Self-pay

## 2020-08-29 ENCOUNTER — Other Ambulatory Visit: Payer: Self-pay

## 2020-08-29 ENCOUNTER — Ambulatory Visit (INDEPENDENT_AMBULATORY_CARE_PROVIDER_SITE_OTHER): Payer: Medicare Other | Admitting: Orthopaedic Surgery

## 2020-08-29 VITALS — Ht 66.0 in | Wt 135.0 lb

## 2020-08-29 DIAGNOSIS — G8929 Other chronic pain: Secondary | ICD-10-CM

## 2020-08-29 DIAGNOSIS — M25562 Pain in left knee: Secondary | ICD-10-CM

## 2020-08-29 DIAGNOSIS — M25561 Pain in right knee: Secondary | ICD-10-CM | POA: Diagnosis not present

## 2020-08-29 NOTE — Progress Notes (Signed)
Office Visit Note   Patient: Nicole Mercer           Date of Birth: 1945/12/12           MRN: [id number] Visit Date: 08/29/2020              Requested by: Josetta Huddle, MD [address] [place],  [place] [address] PCP: Josetta Huddle, MD   Assessment & Plan: Visit Diagnoses:  1. Chronic pain of right knee   2. Chronic pain of left knee     Plan: I did give her reassurance that the joint space is well-maintained in both knees.  She would definitely benefit from physical therapy on both her knees in terms of showing her exercises that can strengthen the muscles around her knees and design a home exercise program for someone who is so active and wants to stay active.  She agrees with trying some outpatient physical therapy.  From my standpoint, follow-up can be as needed unless things worsen.  We will work on setting her up for a few physical therapy sessions with the goal of keeping her active and less stiff with her knees and strengthening her knees.  Follow-Up Instructions: Return if symptoms worsen or fail to improve.   Orders:  Orders Placed This Encounter  Procedures   XR Knee 1-2 Views Right   XR Knee 1-2 Views Left   No orders of the defined types were placed in this encounter.     Procedures: No procedures performed   Clinical Data: No additional findings.   Subjective: Chief Complaint  Patient presents with   Right Knee - Pain  The patient is a very pleasant 75 year old female who comes in for basically what she describes as a baseline assessment of her knees.  She has never injured her knees and is very active at 47.  She travels a lot.  She does like to play pickle ball and be very active.  She reports stiffness in both knees especially this is been sitting for a while.  She reports just some anterior shin pain on occasion.  She denies any locking catching.  She denies any swelling in her knees.  She wants to maintain her level of activity and  function.  She does walk long distances as well.  She is not diabetic and is a thin individual.  HPI  Review of Systems There is currently listed no headache, chest pain, shortness of breath, fever, chills, nausea, vomiting  Objective: Vital Signs: Ht '5\' 6"'$  (1.676 m)   Wt 135 lb (61.2 kg)   BMI 21.79 kg/m   Physical Exam She is alert and orient x3 and in no acute distress Ortho Exam Examination of both knees shows no effusion.  There is neutral alignment.  Her range of motion is full of both knees.  Both knees are ligamentously stable with negative Lachman's and negative McMurray's exams bilaterally. Specialty Comments:  No specialty comments available.  Imaging: XR Knee 1-2 Views Left  Result Date: 08/29/2020 2 views of the left knee show well-maintained joint spaces medially and laterally with some mild patellofemoral arthritic changes.  The alignment is neutral and there is no effusion.  There is no acute changes otherwise.  XR Knee 1-2 Views Right  Result Date: 08/29/2020 2 views of the right knee show neutral alignment overall.  The medial lateral compartments are well-maintained and there is some mild patellofemoral arthritic changes.  There is no acute findings.  PMFS History: Patient Active Problem List   Diagnosis Date Noted   ESOPHAGEAL SPASM 07/29/2007   GERD 07/29/2007   DIVERTICULOSIS OF COLON 07/29/2007   Past Medical History:  Diagnosis Date   Allergy    Anxiety    Cataract    Diverticulosis    Esophageal spasm    GERD (gastroesophageal reflux disease)    Helicobacter pylori gastritis    Osteoporosis     Family History  Problem Relation Age of Onset   Colon polyps Mother    Diabetes Mother    Prostate cancer Father    Diabetes Sister    Brain cancer Sister    Colon cancer Neg Hx    Esophageal cancer Neg Hx    Rectal cancer Neg Hx    Stomach cancer Neg Hx     Past Surgical History:  Procedure Laterality Date   COLONOSCOPY      ESOPHAGOGASTRODUODENOSCOPY     SIGMOIDOSCOPY     UPPER GASTROINTESTINAL ENDOSCOPY     Social History   Occupational History   Occupation: Retired    Fish farm manager: [employer]    Comment: Retired   Occupation: Press photographer    Comment: Part-time at [employer]  Tobacco Use   Smoking status: Former    Packs/day: 1.75    Years: 30.00    Pack years: 52.50    Types: Cigarettes   Smokeless tobacco: Never   Tobacco comments:    quit 2 months ago  Vaping Use   Vaping Use: Never used  Substance and Sexual Activity   Alcohol use: Yes    Alcohol/week: 0.0 standard drinks    Comment: Couple of times a week    Drug use: No   Sexual activity: Not on file

## 2020-09-06 ENCOUNTER — Telehealth: Payer: Self-pay | Admitting: Internal Medicine

## 2020-09-06 NOTE — Telephone Encounter (Signed)
Inbound call from patient. Requesting refill for protonix. Patient have appt scheduled 10/28

## 2020-09-06 NOTE — Telephone Encounter (Signed)
I spoke with Nicole Mercer and she is requesting we send in the pantoprazole  '40mg'$  that she was last on to cover until her appointment in October. She has been taking OTC omeprazole for her heartburn she's been having the past month. She is also having some dysphagia with liquids.  She recently stopped the carafate-she felt it hurt her more than helped her. Please advise if you want her to continue the OTC until appointment or send in rx. I confirmed pharmacy-[facility].

## 2020-09-07 ENCOUNTER — Encounter: Payer: Self-pay | Admitting: Physical Therapy

## 2020-09-07 ENCOUNTER — Ambulatory Visit (INDEPENDENT_AMBULATORY_CARE_PROVIDER_SITE_OTHER): Payer: Medicare Other | Admitting: Physical Therapy

## 2020-09-07 ENCOUNTER — Other Ambulatory Visit: Payer: Self-pay

## 2020-09-07 DIAGNOSIS — M25562 Pain in left knee: Secondary | ICD-10-CM | POA: Diagnosis not present

## 2020-09-07 DIAGNOSIS — M25561 Pain in right knee: Secondary | ICD-10-CM | POA: Diagnosis not present

## 2020-09-07 DIAGNOSIS — R29898 Other symptoms and signs involving the musculoskeletal system: Secondary | ICD-10-CM

## 2020-09-07 MED ORDER — PANTOPRAZOLE SODIUM 40 MG PO TBEC: 40.0000 mg | DELAYED_RELEASE_TABLET | Freq: Every day | ORAL | 0 refills | Status: DC

## 2020-09-07 NOTE — Telephone Encounter (Signed)
I left Nicole Mercer a detailed message about her rx being sent in and to stop the OTC PPI.

## 2020-09-07 NOTE — Therapy (Addendum)
[facility] [address] [place], [place], [address] Phone: [phone]   Fax:  [phone]  Physical Therapy Evaluation/Discharge Summary  Patient Details  Name: Nicole Mercer MRN: [id number] Date of Birth: 1945/05/09 Referring Provider (PT): Mcarthur Rossetti, MD   Encounter Date: 09/07/2020   PT End of Session - 09/07/20 1127     Visit Number 1    Number of Visits 6    Date for PT Re-Evaluation 10/19/20    Authorization Type Medicare/BCBS Federal    Progress Note Due on Visit 10    PT Start Time 5597    PT Stop Time 1055    PT Time Calculation (min) 40 min    Activity Tolerance Patient tolerated treatment well    Behavior During Therapy [facility] for tasks assessed/performed             Past Medical History:  Diagnosis Date   Allergy    Anxiety    Cataract    Diverticulosis    Esophageal spasm    GERD (gastroesophageal reflux disease)    Helicobacter pylori gastritis    Osteoporosis     Past Surgical History:  Procedure Laterality Date   COLONOSCOPY     ESOPHAGOGASTRODUODENOSCOPY     SIGMOIDOSCOPY     UPPER GASTROINTESTINAL ENDOSCOPY      There were no vitals filed for this visit.    Subjective Assessment - 09/07/20 1018     Subjective Pt is a 75 y/o female who presents to OPPT for reports of occasional bil knee pain as well mild weakness.  She is very active and wants to travel and be in good shape to travel.  She has noticed some pain with playing pickleball for the past month, has played for 7-8 years.    Pertinent History anxiety, osteoporosis    Diagnostic tests xrays: negative    Patient Stated Goals improve strength, avoid any injury, decrease pain    Currently in Pain? Yes    Pain Score 0-No pain   up to 4/10   Pain Location Knee    Pain Orientation Right;Left    Pain Descriptors / Indicators Aching;Sharp    Pain Type Acute pain    Pain Onset 1 to 4 weeks ago    Pain Frequency Intermittent     Aggravating Factors  twisting with pickleball    Pain Relieving Factors rest, tylenol (resolves in a few hours)                [facility] PT Assessment - 09/07/20 1012       Assessment   Medical Diagnosis M25.561,G89.29 (ICD-10-CM) - Chronic pain of right knee  M25.562,G89.29 (ICD-10-CM) - Chronic pain of left knee    Referring Provider (PT) Mcarthur Rossetti, MD    Onset Date/Surgical Date --   chronic   Hand Dominance Right    Next MD Visit PRN    Prior Therapy n/a      Precautions   Precautions None      Restrictions   Weight Bearing Restrictions No      Balance Screen   Has the patient fallen in the past 6 months Yes    How many times? 3 - tripping over items    Has the patient had a decrease in activity level because of a fear of falling?  No    Is the patient reluctant to leave their home because of a fear of falling?  No      Home  Environment   Geographical information systems officer residence    Living Arrangements Alone    Type of [place] to enter;Level entry    [place] Two level;Bed/bath upstairs    Alternate Level Stairs-Number of Steps 15    Alternate Level Stairs-Rails Left    Additional Comments holds rail going up/down stairs, no difficulty      Prior Function   Level of Independence Independent    Vocation Part time employment    Vocation Requirements 4.5 hour shifts, standing lfiting up to 25#, 3-4x/wks    Leisure pickleball, cards, go out with friends, dinner, plays, goes to gym (not as consistent this summer - goes to [employer])      Cognition   Overall Cognitive Status Within Functional Limits for tasks assessed      Observation/Other Assessments   Focus on Therapeutic Outcomes (FOTO)  73 (predicted 79)      ROM / Strength   AROM / PROM / Strength AROM;Strength      AROM   Overall AROM Comments bil knees 0-143      Strength   Strength Assessment Site Hip;Knee    Right/Left Hip Right;Left    Right Hip Extension 4/5     Right Hip ABduction 4/5    Right Hip ADduction 5/5    Left Hip Extension 4/5    Left Hip ABduction 4/5    Left Hip ADduction 5/5    Right/Left Knee Right;Left    Right Knee Flexion 4+/5    Right Knee Extension 5/5   60.85#   Left Knee Flexion 4+/5    Left Knee Extension 5/5   56.95#                       Objective measurements completed on examination: See above findings.       [facility] PT Treatment/Exercise - 09/07/20 0001       Exercises   Exercises Other Exercises    Other Exercises  see pt instructions - provided HEP with focus on hip strengthening as well as eccentric quad control.  Performed review of exercises and discussed gym equipment and how to determine appropropriate resistance                    PT Education - 09/07/20 1013     Education Details HEP    Person(s) Educated Patient    Methods Explanation;Demonstration    Comprehension Verbalized understanding;Returned demonstration              PT Short Term Goals - 09/07/20 1133       PT SHORT TERM GOAL #1   Title independent with initial HEP    Status New    Target Date 09/28/20               PT Long Term Goals - 09/07/20 1013       PT LONG TERM GOAL #1   Title independent with HEP    Time 6    Period Weeks    Status New    Target Date 10/19/20      PT LONG TERM GOAL #2   Title FOTO score improved to 79 for improved function    Time 6    Period Weeks    Status New    Target Date 10/19/20      PT LONG TERM GOAL #3   Title report 50% reduction in bil knee  pain while playing pickleball    Time 6    Period Weeks    Status New    Target Date 10/19/20                    Plan - 09/07/20 1128     Clinical Impression Statement Pt is a 75 y/o female who presents to OPPT for bil knee pain, largely associated with eccentric and twisting movements with playing pickleball.  Overall she has some mild strength deficits which was addressed with HEP for  home and gym.  She will benefit from PT to address deficits listed.    Personal Factors and Comorbidities Comorbidity 2    Comorbidities anxiety, osteoporosis    Examination-Activity Limitations Locomotion Level;Squat    Examination-Participation Restrictions Community Activity    Stability/Clinical Decision Making Stable/Uncomplicated    Clinical Decision Making Low    Rehab Potential Good    PT Frequency 1x / week   up to 1x/wk if needed; plan to see in 3 weeks to assess response to HEP   PT Duration 6 weeks    PT Treatment/Interventions ADLs/Self Care Home Management;Cryotherapy;Electrical Stimulation;Moist Heat;Gait training;Stair training;Functional mobility training;Therapeutic activities;Therapeutic exercise;Neuromuscular re-education;Patient/family education;Manual techniques;Dry needling;Taping    PT Next Visit Plan review HEP and update PRN, eccentric quad control    PT Home Exercise Plan Access Code: [email or web address]    Consulted and Agree with Plan of Care Patient             Patient will benefit from skilled therapeutic intervention in order to improve the following deficits and impairments:  Pain, Decreased strength  Visit Diagnosis: Acute pain of right knee - Plan: PT plan of care cert/re-cert  Acute pain of left knee - Plan: PT plan of care cert/re-cert  Other symptoms and signs involving the musculoskeletal system - Plan: PT plan of care cert/re-cert     Problem List Patient Active Problem List   Diagnosis Date Noted   ESOPHAGEAL SPASM 07/29/2007   GERD 07/29/2007   DIVERTICULOSIS OF COLON 07/29/2007      Laureen Abrahams, PT, DPT 09/07/20 11:37 AM     [facility] [address] [place], [place], [address] Phone: [phone]   Fax:  [phone]  Name: ELEANORE JUNIO MRN: [id number] Date of Birth: 09/06/1945     PHYSICAL THERAPY DISCHARGE SUMMARY  Visits from Start of Care: 1  Current functional level  related to goals / functional outcomes: See above   Remaining deficits: See above; pt did cx remaining appts as she is doing well and doesn't feel like she needs   Education / Equipment: HEP   Patient agrees to discharge. Patient goals were not met. Patient is being discharged due to being pleased with the current functional level.  Laureen Abrahams, PT, DPT 09/28/20 11:04 AM  [facility] [address] [place], [place], [address] Phone: [phone]   Fax:  [phone]

## 2020-09-07 NOTE — Patient Instructions (Signed)
Access Code: [email or web address] URL: [email or web address] Date: 09/07/2020 Prepared by: Faustino Congress  Exercises Knee Extension with Weight Machine - 1 x daily - 7 x weekly - 3 sets - 10 reps Eccentric Knee Extension with Weight Machine - 1 x daily - 7 x weekly - 3 sets - 10 reps Hamstring Curl with Weight Machine - 1 x daily - 7 x weekly - 3 sets - 10 reps Eccentric Leg Press - 1 x daily - 7 x weekly - 3 sets - 10 reps Hip Abduction Machine - 1 x daily - 7 x weekly - 3 sets - 10 reps Hip Adduction Machine - 1 x daily - 7 x weekly - 3 sets - 10 reps Goblet Squat with Kettlebell - 1 x daily - 7 x weekly - 3 sets - 10 reps Lateral Lunge - 1 x daily - 7 x weekly - 3 sets - 10 reps Forward Lunge with Rotation - 1 x daily - 7 x weekly - 3 sets - 10 reps Kettlebell Deadlift - 1 x daily - 7 x weekly - 3 sets - 10 reps

## 2020-09-07 NOTE — Telephone Encounter (Signed)
I sent the prescription for 9040 mg pantoprazole's without a refill.  She can stop the OTC

## 2020-09-27 ENCOUNTER — Encounter: Payer: Medicare Other | Admitting: Physical Therapy

## 2020-10-24 DIAGNOSIS — Z23 Encounter for immunization: Secondary | ICD-10-CM | POA: Diagnosis not present

## 2020-11-02 ENCOUNTER — Other Ambulatory Visit: Payer: Self-pay | Admitting: Internal Medicine

## 2020-11-02 ENCOUNTER — Other Ambulatory Visit: Payer: Self-pay

## 2020-11-02 ENCOUNTER — Ambulatory Visit
Admission: RE | Admit: 2020-11-02 | Discharge: 2020-11-02 | Disposition: A | Discharge status: Home / Self Care | Payer: Medicare Other | Source: Ambulatory Visit | Attending: Internal Medicine | Admitting: Internal Medicine

## 2020-11-02 DIAGNOSIS — Z720 Tobacco use: Secondary | ICD-10-CM | POA: Diagnosis not present

## 2020-11-02 DIAGNOSIS — M545 Low back pain, unspecified: Secondary | ICD-10-CM | POA: Diagnosis not present

## 2020-11-02 DIAGNOSIS — Z79890 Hormone replacement therapy (postmenopausal): Secondary | ICD-10-CM | POA: Diagnosis not present

## 2020-11-02 DIAGNOSIS — F322 Major depressive disorder, single episode, severe without psychotic features: Secondary | ICD-10-CM | POA: Diagnosis not present

## 2020-11-02 DIAGNOSIS — E559 Vitamin D deficiency, unspecified: Secondary | ICD-10-CM | POA: Diagnosis not present

## 2020-11-02 DIAGNOSIS — K219 Gastro-esophageal reflux disease without esophagitis: Secondary | ICD-10-CM | POA: Diagnosis not present

## 2020-11-02 DIAGNOSIS — I700 Atherosclerosis of aorta: Secondary | ICD-10-CM | POA: Diagnosis not present

## 2020-11-02 DIAGNOSIS — J449 Chronic obstructive pulmonary disease, unspecified: Secondary | ICD-10-CM | POA: Diagnosis not present

## 2020-11-02 DIAGNOSIS — Z0000 Encounter for general adult medical examination without abnormal findings: Secondary | ICD-10-CM | POA: Diagnosis not present

## 2020-11-11 ENCOUNTER — Encounter: Payer: Self-pay | Admitting: Internal Medicine

## 2020-11-11 ENCOUNTER — Ambulatory Visit (INDEPENDENT_AMBULATORY_CARE_PROVIDER_SITE_OTHER): Payer: Medicare Other | Admitting: Internal Medicine

## 2020-11-11 VITALS — BP 108/60 | HR 68 | Ht 66.0 in | Wt 140.0 lb

## 2020-11-11 DIAGNOSIS — K219 Gastro-esophageal reflux disease without esophagitis: Secondary | ICD-10-CM

## 2020-11-11 DIAGNOSIS — R3129 Other microscopic hematuria: Secondary | ICD-10-CM | POA: Diagnosis not present

## 2020-11-11 NOTE — Patient Instructions (Addendum)
There is much written and talked about possible health problems with the use of PPI medications (omeprazole (Prilosec), esomeprazole (Nexium), rabeprazole (Aciphex), lansoprazole (Prevacid), Dexlansoprazole (Dexilant) and pantoprazole ( Protonix).  The truth is the studies that suggest health problems with the use of PPI medications only find a weak association at best with the possible diseases reported to occur like dementia, bone fracture and kidney failure and even death. Many sicker patients with multiple health problems end up on these medications for various reasons. Establishing cause and effect in medical research is extremely difficult and though I believe there are very rare occurrences in which PPI's do cause harm in these ways, the benefits of you taking your PPI outweigh these largely theoretical and sometimes directly disproven risks.  As always, I/we will strive to have you on the lowest effective dose and frequency of a medication to keep you well and with a good quality of life.   I appreciate the opportunity to care for you. Silvano Rusk, MD, [facility]

## 2020-11-11 NOTE — Progress Notes (Signed)
Nicole Mercer 75 y.o. 02-10-1945 [id number]  Assessment & Plan:   Encounter Diagnosis  Name Primary?   Gastroesophageal reflux disease, unspecified whether esophagitis present Yes    I think she has proven that she is one who benefits from PPI.  I reassured her about the safety of these medications and how the retrospective studies that associated PPI use with things like dementia, bone fractures and kidney failure showed weak associations at best and we do not know that is cause-and-effect.  Furthermore in someone who gets clinical benefit like she does it makes sense to continue these so she will stay on 40 mg PPI in the form of pantoprazole daily and see me every 2 years sooner as needed.  She may forego that routine follow-up with me if primary care refills her PPI.  CC: Nicole Huddle, MD     Subjective:   Chief Complaint: GERD globus dysphagia  HPI  Nicole Mercer is a 75 year old woman with a history of GERD/dyspepsia and globus and dysphagia sensation.  I saw her last year and at that time she was doing well and we discussed the pros and cons of PPI therapy and decided to stop that and continue sucralfate as needed.  Over the summer she started to having heartburn again and some globus and dysphagia symptoms.  She went back on an over-the-counter PPI but it was not providing adequate symptom relief so she called [place] in August and I restarted pantoprazole 40 mg daily.  She is much better essentially asymptomatic.  She is concerned because "they say you cannot be on these medicines long-term".  EGD 06/17/2017 - Non-bleeding erosive gastropathy. Biopsied. - The examination was otherwise normal. - Dilation performed in the entire esophagus.    Allergies  Allergen Reactions   Epinephrine     Rapid heart rate   Current Meds  Medication Sig   CALCIUM PO Take by mouth 2 (two) times daily.   clonazePAM (KLONOPIN) 1 MG tablet Takes one tablet by mouth at bedtime   estradiol (ESTRACE)  1 MG tablet Takes one tablet by mouth once daily   hydrOXYzine (ATARAX/VISTARIL) 25 MG tablet Take 25 mg by mouth at bedtime.   levocetirizine (XYZAL) 5 MG tablet Take 5 mg by mouth every evening.   Multiple Vitamins-Minerals (MULTIVITAMIN ADULT PO) Take by mouth.   pantoprazole (PROTONIX) 40 MG tablet Take 1 tablet (40 mg total) by mouth daily before breakfast.   progesterone (PROMETRIUM) 200 MG capsule Take by mouth at bedtime. Every 3 months   sertraline (ZOLOFT) 100 MG tablet Takes one tablet by mouth once daily   sucralfate (CARAFATE) 1 g tablet TAKE 1 TABLET (1 G TOTAL) BY MOUTH 4 (FOUR) TIMES DAILY - WITH MEALS AND AT BEDTIME. (Patient taking differently: Take 1 g by mouth at bedtime.)   topiramate (TOPAMAX) 25 MG capsule Take 25 mg by mouth as needed.   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  2 sons one daughter   Divorced   Daily caffeine    family history includes Brain cancer in her sister; Colon polyps in her mother; Diabetes in her mother and sister; Prostate cancer in her father.   Review of Systems As per HPI  Objective:   Physical Exam  BP 108/60   Pulse 68   Ht 5\' 6"  (1.676 m)   Wt 140 lb (63.5 kg)   BMI 22.60 kg/m

## 2020-11-22 ENCOUNTER — Telehealth: Payer: Self-pay | Admitting: Orthopaedic Surgery

## 2020-11-22 NOTE — Telephone Encounter (Signed)
Pt called and states she twisted her right knee and its swollen and sore (scale 1 to 10.Marland KitchenMarland KitchenA 4). She would like to know what to do and if she did sprain it how long it takes to heal. Pt states that she is wearing a knee brace rn.   CB [phone]

## 2020-11-22 NOTE — Telephone Encounter (Signed)
Patient aware she does not need to wear brace ALL the time Ice and elevate If pain continues she can make an appt to be seen

## 2020-11-29 ENCOUNTER — Other Ambulatory Visit: Payer: Self-pay | Admitting: Internal Medicine

## 2020-11-30 ENCOUNTER — Other Ambulatory Visit: Payer: Self-pay | Admitting: Internal Medicine

## 2020-11-30 DIAGNOSIS — Z720 Tobacco use: Secondary | ICD-10-CM

## 2020-12-05 ENCOUNTER — Telehealth: Payer: Self-pay | Admitting: Internal Medicine

## 2020-12-05 NOTE — Telephone Encounter (Signed)
Prior authorization done over the phone at # [id number] for her pantoprazole , dx : GERD. Approved thru 12/05/2021 and [facility] informed and also And[facility] informed and she said thank you.

## 2020-12-05 NOTE — Telephone Encounter (Signed)
Inbound call from Patient. States that the Pharmacy called her and said they would not refill her Protonix because her insurance denied it. Patient wants to know if we could override the insurance company?

## 2020-12-19 ENCOUNTER — Ambulatory Visit (INDEPENDENT_AMBULATORY_CARE_PROVIDER_SITE_OTHER): Payer: Medicare Other

## 2020-12-19 ENCOUNTER — Ambulatory Visit (INDEPENDENT_AMBULATORY_CARE_PROVIDER_SITE_OTHER): Payer: Medicare Other | Admitting: Orthopaedic Surgery

## 2020-12-19 DIAGNOSIS — M25561 Pain in right knee: Secondary | ICD-10-CM | POA: Diagnosis not present

## 2020-12-19 MED ORDER — METHYLPREDNISOLONE ACETATE 40 MG/ML IJ SUSP
40.0000 mg | INTRAMUSCULAR | Status: AC | PRN
  Administered 2020-12-19: 40 mg via INTRA_ARTICULAR

## 2020-12-19 MED ORDER — LIDOCAINE HCL 1 % IJ SOLN
3.0000 mL | INTRAMUSCULAR | Status: AC | PRN
  Administered 2020-12-19: 3 mL

## 2020-12-19 NOTE — Progress Notes (Signed)
Office Visit Note   Patient: Nicole Mercer           Date of Birth: [birth year]           MRN: [id number] Visit Date: 12/19/2020              Requested by: Josetta Huddle, MD [address] [place],  [place] [address] PCP: Josetta Huddle, MD   Assessment & Plan: Visit Diagnoses:  1. Acute pain of right knee     Plan: On exam I gave her reassurance that her right knee feels structurally sound.  She may have some type of irritation or tearing of the medial meniscus but she is not having any locking catching.  I did recommend a steroid injection today in her right knee to see if this will help her symptoms.  She agrees with this treatment plan.  She will hold off on pickleball least another week to 2 weeks.  I will see her back in 6 weeks to see how this is done for her.  My next step would be considering an MRI if she still symptomatic.  Follow-Up Instructions: Return in about 6 weeks (around 01/30/2021).   Orders:  Orders Placed This Encounter  Procedures   Large Joint Inj   XR Knee 1-2 Views Right   No orders of the defined types were placed in this encounter.     Procedures: Large Joint Inj: R knee on 12/19/2020 11:02 AM Indications: diagnostic evaluation and pain Details: 22 G 1.5 in needle, superolateral approach  Arthrogram: No  Medications: 3 mL lidocaine 1 %; 40 mg methylPREDNISolone acetate 40 MG/ML Outcome: tolerated well, no immediate complications Procedure, treatment alternatives, risks and benefits explained, specific risks discussed. Consent was given by the patient. Immediately prior to procedure a time out was called to verify the correct patient, procedure, equipment, support staff and site/side marked as required. Patient was prepped and draped in the usual sterile fashion.      Clinical Data: No additional findings.   Subjective: Chief Complaint  Patient presents with   Right Knee - Pain  The patient is very pleasant and active  75 year old female who injured her right knee about a month ago twisting it while playing pickle ball.  It still bothers her in the back of the knee.  There is been only some slight swelling but no locking catching and hurts in the posterior aspect of her knee with pivoting types of activities.  She has never had any surgery before or any injections in her knee.  She has never injured this knee before either.  HPI  Review of Systems There is currently listed no fever, chills, nausea, vomiting  Objective: Vital Signs: There were no vitals taken for this visit.  Physical Exam She is alert and orient x3 and in no acute distress Ortho Exam Examination of her left knee is normal examination of her right knee is the more painful knee shows some pain in the posterior medial aspect of the knee when rotating the tibia on the femur.  There is no effusion.  The knee feels ligamentously stable. Specialty Comments:  No specialty comments available.  Imaging: XR Knee 1-2 Views Right  Result Date: 12/19/2020 2 views of the right knee show well-maintained medial lateral compartments with normal alignment.  There is mild patellofemoral narrowing.  There is no acute findings.    PMFS History: Patient Active Problem List   Diagnosis Date Noted   ESOPHAGEAL SPASM  07/29/2007   GERD 07/29/2007   DIVERTICULOSIS OF COLON 07/29/2007   Past Medical History:  Diagnosis Date   Allergy    Anxiety    Cataract    Diverticulosis    Esophageal spasm    GERD (gastroesophageal reflux disease)    Helicobacter pylori gastritis    Osteoporosis    Urticaria     Family History  Problem Relation Age of Onset   Colon polyps Mother    Diabetes Mother    Prostate cancer Father    Diabetes Sister    Brain cancer Sister    Colon cancer Neg Hx    Esophageal cancer Neg Hx    Rectal cancer Neg Hx    Stomach cancer Neg Hx     Past Surgical History:  Procedure Laterality Date   COLONOSCOPY      ESOPHAGOGASTRODUODENOSCOPY     ROTATOR CUFF REPAIR  2021   SIGMOIDOSCOPY     UPPER GASTROINTESTINAL ENDOSCOPY     Social History   Occupational History   Occupation: Retired    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

## 2020-12-22 ENCOUNTER — Ambulatory Visit
Admission: RE | Admit: 2020-12-22 | Discharge: 2020-12-22 | Disposition: A | Discharge status: Home / Self Care | Payer: Federal, State, Local not specified - PPO | Source: Ambulatory Visit | Attending: Internal Medicine | Admitting: Internal Medicine

## 2020-12-22 DIAGNOSIS — I2510 Atherosclerotic heart disease of native coronary artery without angina pectoris: Secondary | ICD-10-CM | POA: Diagnosis not present

## 2020-12-22 DIAGNOSIS — K449 Diaphragmatic hernia without obstruction or gangrene: Secondary | ICD-10-CM | POA: Diagnosis not present

## 2020-12-22 DIAGNOSIS — Z87891 Personal history of nicotine dependence: Secondary | ICD-10-CM | POA: Diagnosis not present

## 2020-12-22 DIAGNOSIS — Z720 Tobacco use: Secondary | ICD-10-CM

## 2020-12-22 DIAGNOSIS — J439 Emphysema, unspecified: Secondary | ICD-10-CM | POA: Diagnosis not present

## 2020-12-26 ENCOUNTER — Other Ambulatory Visit: Payer: Self-pay | Admitting: *Deleted

## 2020-12-26 DIAGNOSIS — Z87891 Personal history of nicotine dependence: Secondary | ICD-10-CM

## 2020-12-27 DIAGNOSIS — Z124 Encounter for screening for malignant neoplasm of cervix: Secondary | ICD-10-CM | POA: Diagnosis not present

## 2020-12-27 DIAGNOSIS — Z1231 Encounter for screening mammogram for malignant neoplasm of breast: Secondary | ICD-10-CM | POA: Diagnosis not present

## 2020-12-27 DIAGNOSIS — Z6822 Body mass index (BMI) 22.0-22.9, adult: Secondary | ICD-10-CM | POA: Diagnosis not present

## 2020-12-28 DIAGNOSIS — Z124 Encounter for screening for malignant neoplasm of cervix: Secondary | ICD-10-CM | POA: Diagnosis not present

## 2021-01-30 ENCOUNTER — Encounter: Payer: Self-pay | Admitting: Orthopaedic Surgery

## 2021-01-30 ENCOUNTER — Ambulatory Visit (INDEPENDENT_AMBULATORY_CARE_PROVIDER_SITE_OTHER): Payer: Medicare Other | Admitting: Orthopaedic Surgery

## 2021-01-30 ENCOUNTER — Other Ambulatory Visit: Payer: Self-pay

## 2021-01-30 DIAGNOSIS — G8929 Other chronic pain: Secondary | ICD-10-CM

## 2021-01-30 DIAGNOSIS — M25561 Pain in right knee: Secondary | ICD-10-CM

## 2021-01-30 NOTE — Progress Notes (Signed)
The patient is a 76 year old female well-known to me.  She is very active and does play pickle ball.  We saw her back in December for her right knee after an acute injury to that right knee.  We did place a steroid injection in her knee and she is worked on activity modification and quad strengthening exercises as well as taking anti-inflammatories.  She still has a lot of pain in the medial aspect of her knee.  She says the injection did temporize her pain for a little bit.  The pain in her right knee is now waking her up at night.  She is still refrain from high impact aerobic activities.  It hurts even now when she performs yoga.  Examination of her right knee shows significant medial joint line tenderness and a positive McMurray's sign to the medial compartment of the right knee.  Her previous x-rays a few weeks ago of her right knee showed well-maintained medial lateral compartments.  At this point given her continued pain we need to obtain an MRI of her right knee to rule out a stress fracture or meniscal tear and to assess the cartilage in her knee given the failure of conservative treatment for well over 6 weeks.  She agrees with this treatment plan.  We will see her in follow-up after we have this MRI.

## 2021-02-06 ENCOUNTER — Telehealth: Payer: Self-pay | Admitting: Orthopaedic Surgery

## 2021-02-06 NOTE — Telephone Encounter (Signed)
LMOM for pt to return call to sch for post MRI after 02/12/21 with CB

## 2021-02-12 ENCOUNTER — Ambulatory Visit
Admission: RE | Admit: 2021-02-12 | Discharge: 2021-02-12 | Disposition: A | Discharge status: Home / Self Care | Payer: Federal, State, Local not specified - PPO | Source: Ambulatory Visit | Attending: Orthopaedic Surgery | Admitting: Orthopaedic Surgery

## 2021-02-12 ENCOUNTER — Other Ambulatory Visit: Payer: Self-pay

## 2021-02-12 DIAGNOSIS — G8929 Other chronic pain: Secondary | ICD-10-CM

## 2021-02-12 DIAGNOSIS — M25461 Effusion, right knee: Secondary | ICD-10-CM | POA: Diagnosis not present

## 2021-02-12 DIAGNOSIS — M660 Rupture of popliteal cyst: Secondary | ICD-10-CM | POA: Diagnosis not present

## 2021-02-12 DIAGNOSIS — M1711 Unilateral primary osteoarthritis, right knee: Secondary | ICD-10-CM | POA: Diagnosis not present

## 2021-02-12 DIAGNOSIS — S83281A Other tear of lateral meniscus, current injury, right knee, initial encounter: Secondary | ICD-10-CM | POA: Diagnosis not present

## 2021-02-15 ENCOUNTER — Other Ambulatory Visit: Payer: Self-pay

## 2021-02-15 ENCOUNTER — Encounter: Payer: Self-pay | Admitting: Orthopaedic Surgery

## 2021-02-15 ENCOUNTER — Ambulatory Visit (INDEPENDENT_AMBULATORY_CARE_PROVIDER_SITE_OTHER): Payer: Medicare Other | Admitting: Orthopaedic Surgery

## 2021-02-15 DIAGNOSIS — S83281D Other tear of lateral meniscus, current injury, right knee, subsequent encounter: Secondary | ICD-10-CM

## 2021-02-15 NOTE — Progress Notes (Signed)
HPI: Mrs. Iacovelli returns today to go over the MRI of her right knee.  She continues to have right knee pain despite conservative treatment.  With his limiting her ability to exercise.  Most pains medial aspect and posterior aspect of the knee.  No mechanical symptoms. MRI right knee dated 02/13/2021 is reviewed with the patient.  Mild to moderate tricompartmental changes.  Positive effusion.  Lateral meniscal tear at the root.  Also moderate extrusion of the body of the lateral meniscus.  An additional horizontal tear extends through the superior articular surface of the mid AP dimension of the body lateral meniscus.  Medial meniscus degenerative changes but no frank tear.  Physical exam: Right knee tenderness along medial joint line.  McMurray's is positive.  Good range of motion of the knee no abnormal warmth or erythema.  Right calf supple nontender.  Impression: Right knee lateral meniscal tear  Plan: Given the fact that patient had acute onset of her right knee pain with a twisting injury recommend right knee arthroscopy with partial lateral meniscectomy.  Discussed with her this would not treat the arthritic changes in her knee.  She had no pain in the knee prior to the twisting injury.  We will see her back 1 week postop.  Postoperative protocol discussed with patient.  Risk benefits of surgery discussed with patient at length.

## 2021-03-16 ENCOUNTER — Other Ambulatory Visit: Payer: Self-pay | Admitting: Orthopaedic Surgery

## 2021-03-16 ENCOUNTER — Telehealth: Payer: Self-pay | Admitting: Orthopaedic Surgery

## 2021-03-16 DIAGNOSIS — M6751 Plica syndrome, right knee: Secondary | ICD-10-CM | POA: Diagnosis not present

## 2021-03-16 DIAGNOSIS — M94261 Chondromalacia, right knee: Secondary | ICD-10-CM | POA: Diagnosis not present

## 2021-03-16 DIAGNOSIS — S83271D Complex tear of lateral meniscus, current injury, right knee, subsequent encounter: Secondary | ICD-10-CM | POA: Diagnosis not present

## 2021-03-16 DIAGNOSIS — G8918 Other acute postprocedural pain: Secondary | ICD-10-CM | POA: Diagnosis not present

## 2021-03-16 DIAGNOSIS — Y999 Unspecified external cause status: Secondary | ICD-10-CM | POA: Diagnosis not present

## 2021-03-16 DIAGNOSIS — X58XXXA Exposure to other specified factors, initial encounter: Secondary | ICD-10-CM | POA: Diagnosis not present

## 2021-03-16 DIAGNOSIS — S83271A Complex tear of lateral meniscus, current injury, right knee, initial encounter: Secondary | ICD-10-CM | POA: Diagnosis not present

## 2021-03-16 MED ORDER — HYDROCODONE-ACETAMINOPHEN 10-325 MG PO TABS: 1.0000 | ORAL_TABLET | Freq: Four times a day (QID) | ORAL | 0 refills | Status: DC | PRN

## 2021-03-16 MED ORDER — HYDROCODONE-ACETAMINOPHEN 5-325 MG PO TABS: 1.0000 | ORAL_TABLET | Freq: Four times a day (QID) | ORAL | 0 refills | Status: DC | PRN

## 2021-03-16 NOTE — Telephone Encounter (Signed)
Called and advised.

## 2021-03-16 NOTE — Telephone Encounter (Signed)
Please advise 

## 2021-03-16 NOTE — Telephone Encounter (Signed)
Pt called requesting to change hydrocodone to 10 mg. Pt state her pharmacy is out of stock hydro 5 mg and unsure when they will be back in  stock. Please call pt about this matter at [phone]. ?

## 2021-03-22 IMAGING — CT CT CARDIAC CORONARY ARTERY CALCIUM SCORE
3 series · 12 of 20 positions shown, 14 images · non-contrast
Comparison: 12/01/2018

CLINICAL DATA: Known calcifications

EXAM:
CT CARDIAC CORONARY ARTERY CALCIUM SCORE
TECHNIQUE: Non-contrast imaging through the heart was performed using
prospective ECG gating. Image post processing was performed on an
independent workstation, allowing for quantitative analysis of the
heart and coronary arteries. Note that this exam targets the heart
and the chest was not imaged in its entirety.

[Series 2: calcium scoring 2.00 qr36 bestdiast 71% hrt calciu · axial · 0.37mm/px · z∈[+1800,+1832]mm · 2 of 80 slices shown]
[im 16/80  vessel]
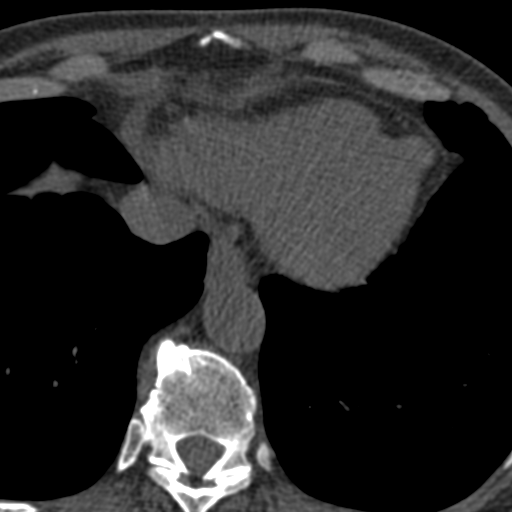
[im 32/80  vessel]
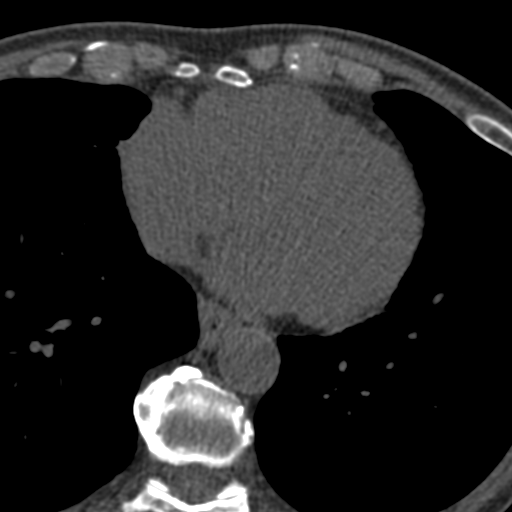

[Series 3: calcium scoring 2.00 br40 bestdiast 71% axial · axial · 0.49mm/px · z∈[+1796,+1900]mm · 5 of 80 slices shown, 7 images]
[im 14/80  vessel]
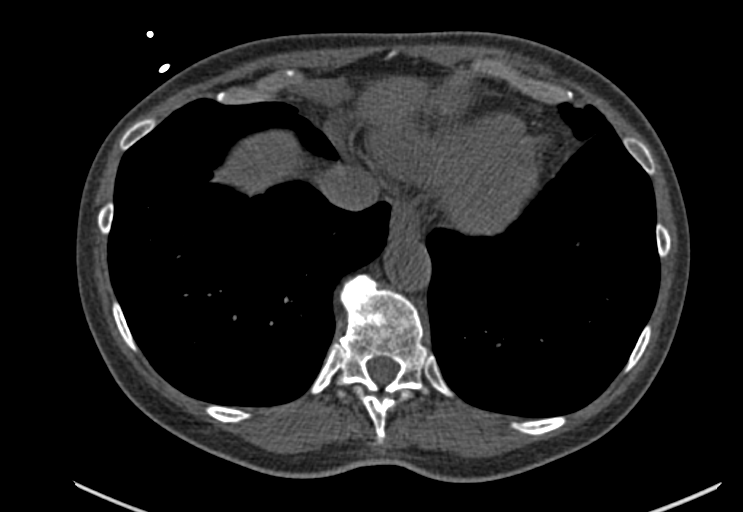
[im 14/80  lung]
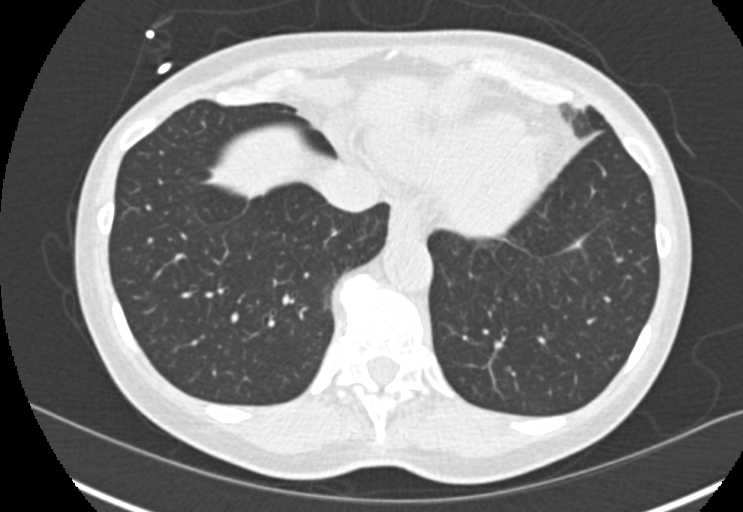
[im 27/80  vessel]
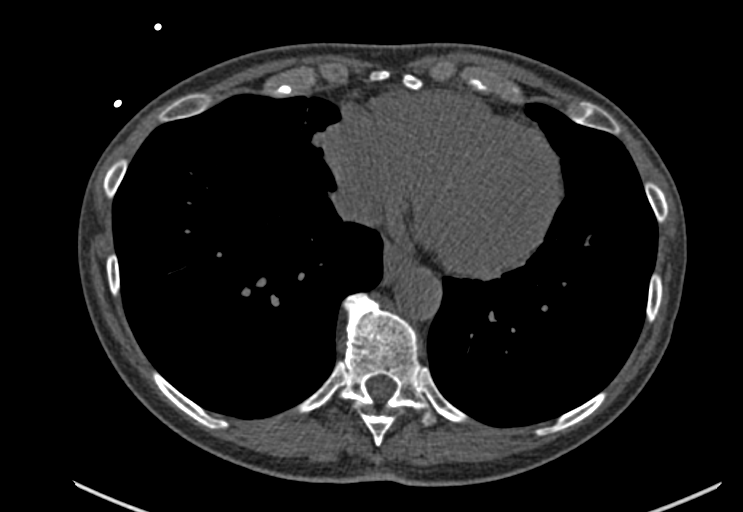
[im 40/80  vessel]
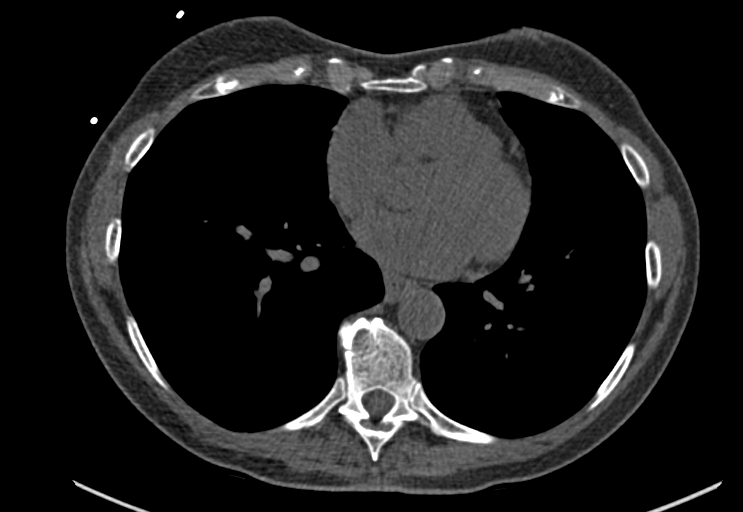
[im 53/80  vessel]
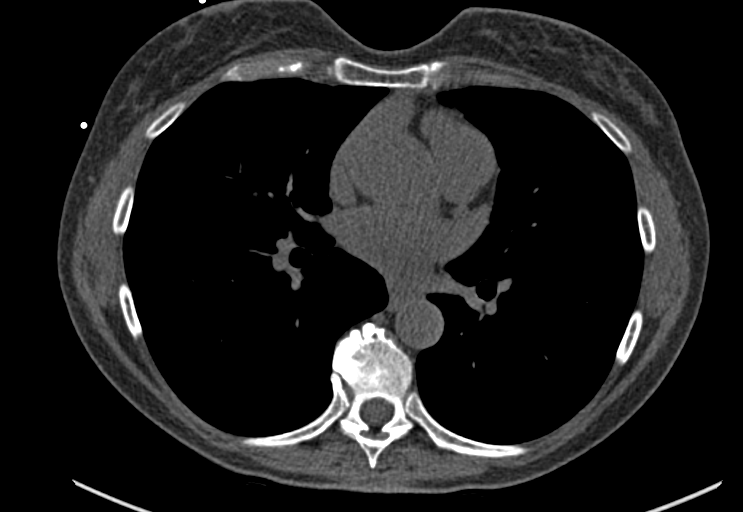
[im 66/80  vessel]
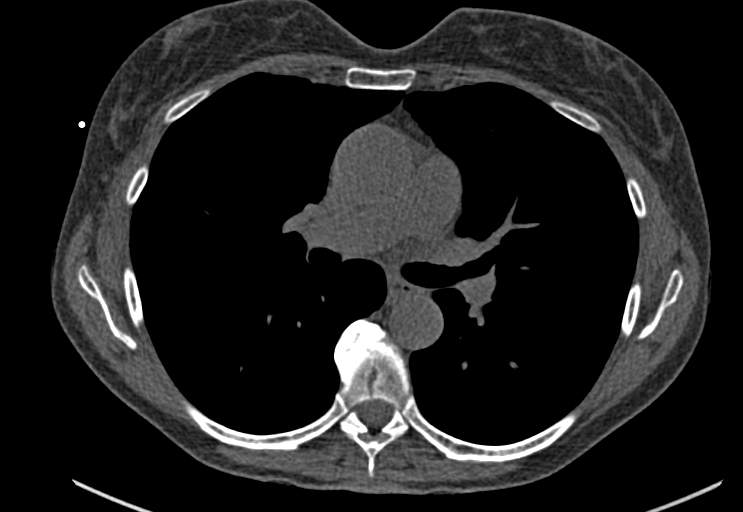
[im 66/80  lung]
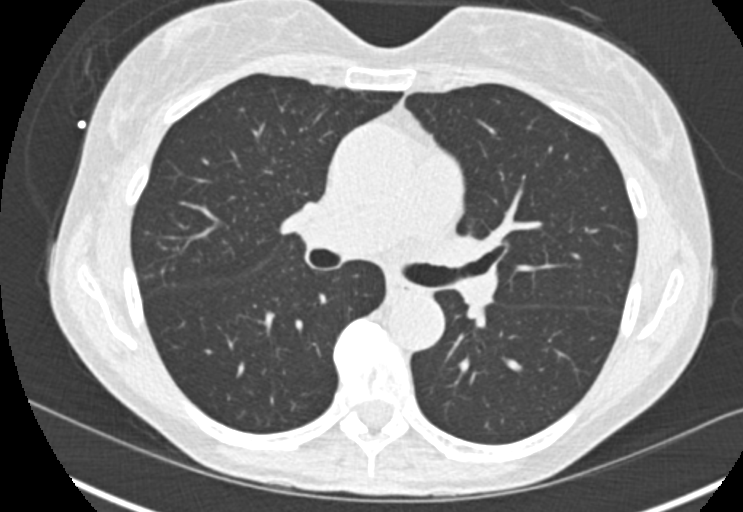

[Series 9: calcium scoring 2.00 br60 bestdiast 71% lungs · axial · 0.49mm/px · z∈[+1796,+1900]mm · 5 of 80 slices shown]
[im 14/80  vessel]
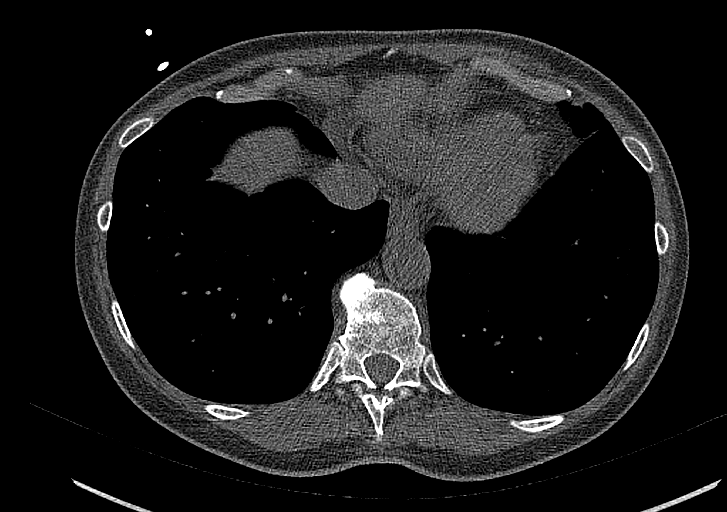
[im 27/80  vessel]
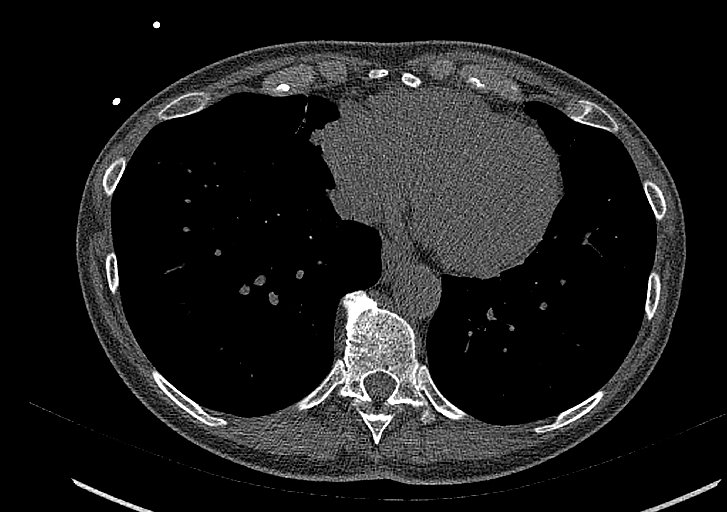
[im 40/80  vessel]
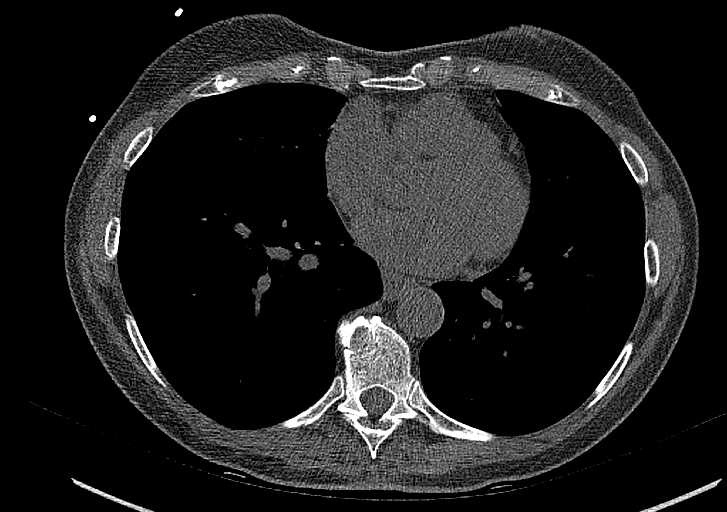
[im 53/80  vessel]
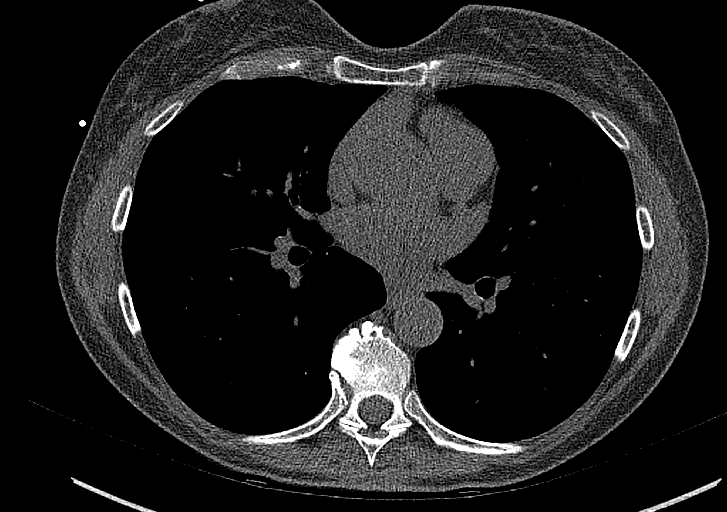
[im 66/80  vessel]
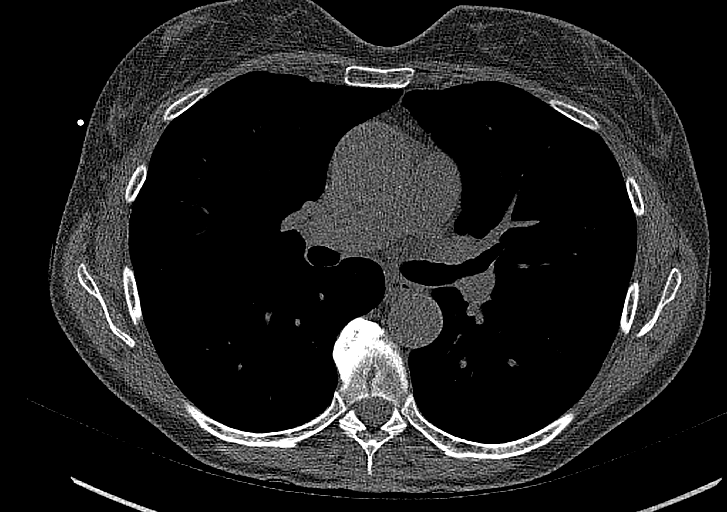

[12 of 20 positions shown; findings below may reference images not displayed]

FINDINGS: CORONARY CALCIUM SCORES:

Left Main: 0

LAD: 0

LCx: 0

RCA: 0

Total Agatston Score: 0

[HOSPITAL] percentile: 0

AORTA MEASUREMENTS:

Ascending Aorta: 37 mm

Descending Aorta: 24 mm

OTHER FINDINGS:

Heart is normal size. Aorta normal caliber Peri calcifications seen
in the distal aortic arch/proximal descending thoracic aorta. No
adenopathy. Small nodule in the right upper lobe along the minor
fissure laterally. Peripheral subpleural nodule in the right lower
lobe on image 29. Small left lower lobe nodule on image 26. These
are all unchanged. Scarring in the lingula and right middle lobe. No
effusions.Imaging into the upper abdomen demonstrates no acute
findings. Chest wall soft tissues are unremarkable. No acute bony
abnormality.
IMPRESSION: No visible coronary artery calcifications. Total coronary calcium
score of 0.

Small bilateral pulmonary nodules are stable since prior study.

Aortic atherosclerosis.

## 2021-03-23 ENCOUNTER — Other Ambulatory Visit: Payer: Self-pay

## 2021-03-23 ENCOUNTER — Encounter: Payer: Self-pay | Admitting: Orthopaedic Surgery

## 2021-03-23 ENCOUNTER — Ambulatory Visit (INDEPENDENT_AMBULATORY_CARE_PROVIDER_SITE_OTHER): Payer: Federal, State, Local not specified - PPO | Admitting: Orthopaedic Surgery

## 2021-03-23 DIAGNOSIS — Z9889 Other specified postprocedural states: Secondary | ICD-10-CM

## 2021-03-23 DIAGNOSIS — S83281D Other tear of lateral meniscus, current injury, right knee, subsequent encounter: Secondary | ICD-10-CM

## 2021-03-23 MED ORDER — HYDROCODONE-ACETAMINOPHEN 10-325 MG PO TABS: 1.0000 | ORAL_TABLET | Freq: Four times a day (QID) | ORAL | 0 refills | Status: AC | PRN

## 2021-03-23 NOTE — Progress Notes (Signed)
The patient is a 76 year old avid pickleball player who is returning 1 week after a right knee arthroscopy with a partial lateral meniscectomy.  We did find some grade 2 to grade III chondromalacia of the medial femoral condyle and the lateral tibial plateau.  There was no areas of exposed bone.  Her ligaments are all intact.  The meniscal tear was more of the anterior aspect of the lateral meniscus.  She is having a lot of pain in her knee today.  She has been off of pain medication for few days to see what her knee would do for her. ? ?On exam her calf is soft.  I was able to aspirate about 20 cc of fluid off her knee and place a steroid in her right knee joint and this gave her a lot of relief.  This help her flex and extend her knee better. ? ?I will send in some more hydrocodone and have encouraged her to take over-the-counter anti-inflammatories.  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  I will see her back in 4 weeks to see how she is doing after course of therapy.  All questions and concerns were answered and addressed. ?

## 2021-03-23 NOTE — Addendum Note (Signed)
Addended by: Meyer Cory on: 03/23/2021 05:09 PM ? ? Modules accepted: Orders ? ?

## 2021-03-27 ENCOUNTER — Ambulatory Visit (INDEPENDENT_AMBULATORY_CARE_PROVIDER_SITE_OTHER): Payer: Medicare Other | Admitting: Physical Therapy

## 2021-03-27 ENCOUNTER — Encounter: Payer: Self-pay | Admitting: Physical Therapy

## 2021-03-27 ENCOUNTER — Other Ambulatory Visit: Payer: Self-pay

## 2021-03-27 DIAGNOSIS — M25561 Pain in right knee: Secondary | ICD-10-CM | POA: Diagnosis not present

## 2021-03-27 DIAGNOSIS — M25562 Pain in left knee: Secondary | ICD-10-CM

## 2021-03-27 DIAGNOSIS — R600 Localized edema: Secondary | ICD-10-CM

## 2021-03-27 DIAGNOSIS — R262 Difficulty in walking, not elsewhere classified: Secondary | ICD-10-CM | POA: Diagnosis not present

## 2021-03-27 NOTE — Therapy (Signed)
OUTPATIENT PHYSICAL THERAPY LOWER EXTREMITY EVALUATION   Patient Name: Nicole Mercer MRN: [id number] DOB:1945-10-13, 76 y.o., female Today's Date: 03/27/2021   PT End of Session - 03/27/21 1300     Visit Number 1    Number of Visits 7    Date for PT Re-Evaluation 05/12/21    PT Start Time 1104    PT Stop Time 1144    PT Time Calculation (min) 40 min    Activity Tolerance Patient tolerated treatment well    Behavior During Therapy [facility] for tasks assessed/performed             Past Medical History:  Diagnosis Date   Allergy    Anxiety    Cataract    Diverticulosis    Esophageal spasm    GERD (gastroesophageal reflux disease)    Helicobacter pylori gastritis    Osteoporosis    Urticaria    Past Surgical History:  Procedure Laterality Date   COLONOSCOPY     ESOPHAGOGASTRODUODENOSCOPY     ROTATOR CUFF REPAIR  2021   SIGMOIDOSCOPY     UPPER GASTROINTESTINAL ENDOSCOPY     Patient Active Problem List   Diagnosis Date Noted   ESOPHAGEAL SPASM 07/29/2007   GERD 07/29/2007   DIVERTICULOSIS OF COLON 07/29/2007    PCP: Josetta Huddle, MD  REFERRING PROVIDER: Mcarthur Rossetti, MD  REFERRING DIAG: [phone] (ICD-10-CM) - Acute lateral meniscus tear of right knee, subsequent encounter  THERAPY DIAG:  Acute pain of right knee  Acute pain of left knee  Difficulty in walking, not elsewhere classified  Localized edema  ONSET DATE: October 2022, March 02/2021 s/p lateral meniscus   SUBJECTIVE:   SUBJECTIVE STATEMENT: Pt arriving to therapy reporting pain in her Rt knee of 3/10. Pt reporting difficulty walking down the stairs.  PERTINENT HISTORY: anxiety, osteoporosis  PAIN:  Are you having pain?  3/10 Location: Rt knee in front Pain relieving: tylenol Pain aggravating: bending, prolonged activity  PRECAUTIONS: None  WEIGHT BEARING RESTRICTIONS No  FALLS:  Has patient fallen in last 6 months? No, Number of falls: 0  LIVING ENVIRONMENT: Lives  with: lives alone Lives in: House/apartment Stairs: Yes; Internal: 15 steps; on left going up Has following equipment at home: None  OCCUPATION: retail sales  PLOF: Independent  PATIENT GOALS Stop hurting, back to work without pain   OBJECTIVE:   DIAGNOSTIC FINDINGS: MRI  PATIENT SURVEYS:  03/27/2021: FOTO: 41 % (predicted 62%)  COGNITION:  Overall cognitive status: Within functional limits for tasks assessed     SENSATION: Light touch: WFL  MUSCLE LENGTH: Hamstrings: Right 80 deg; Left 90 deg   POSTURE:  Rounded shoulders  PALPATION:  TTP: around anterior medial joint line.   LE ROM:  Active ROM Right 03/27/2021 Left 03/27/2021  Hip flexion    Hip extension    Hip abduction    Hip adduction    Hip internal rotation    Hip external rotation    Knee flexion 110 138  Knee extension -5 0  Ankle dorsiflexion    Ankle plantarflexion    Ankle inversion    Ankle eversion     (Blank rows = not tested)  LE MMT:  MMT Right 03/27/2021 Left 03/27/2021  Hip flexion 5/5 5/5  Hip extension    Hip abduction 5/5 5/5  Hip adduction 5/5 5/5  Hip internal rotation    Hip external rotation    Knee flexion 3/5 5/5  Knee extension 3/5 5/5  Ankle dorsiflexion  Ankle plantarflexion    Ankle inversion    Ankle eversion     (Blank rows = not tested)    FUNCTIONAL TESTS:  5 times sit to stand: 20 seconds with UE support  GAIT: Distance walked: 50 feet Assistive device utilized: None Level of assistance: Complete Independence Comments: step through gait pattern    TODAY'S TREATMENT: TherEx:  Supine: Bridges x 10 holding 5 seconds         SLR: x 10        SLR with ER x 10        QS x 10  Seated: sit to stand x 5  Aerobic: recumbent bike x 8 minutes       PATIENT EDUCATION:  Education details: pt Person educated: Patient Education method: Consulting civil engineer, Media planner, Corporate treasurer cues, and Verbal cues Education comprehension: verbalized understanding and  returned demonstration   HOME EXERCISE PROGRAM: Access Code: [email or web address] URL: [email or web address] Date: 03/27/2021 Prepared by: Kearney Hard  Exercises Supine Active Straight Leg Raise - 3 x daily - 7 x weekly - 2 sets - 10 reps Straight Leg Raise with External Rotation - 3 x daily - 7 x weekly - 2 sets - 10 reps Supine Bridge - 3 x daily - 7 x weekly - 2 sets - 10 reps Supine Quad Set - 3 x daily - 7 x weekly - 2 sets - 10 reps Sit to Stand - 3 x daily - 7 x weekly - 2 sets - 10 reps   ASSESSMENT:  CLINICAL IMPRESSION: Patient is a 76 y.o. who comes to clinic with complaints of right knee pain s/p lateral meniscus repair on 03/16/2021. Pt arriving with mobility, strength and movement coordination deficits that impair their ability to perform usual daily and recreational functional activities without increase difficulty/symptoms at this time.  Patient to benefit from skilled PT services to address impairments and limitations to improve to previous level of function without restriction secondary to condition.    OBJECTIVE IMPAIRMENTS Abnormal gait, decreased activity tolerance, decreased balance, decreased mobility, difficulty walking, decreased ROM, increased edema, and pain.   ACTIVITY LIMITATIONS community activity and occupation.   PERSONAL FACTORS anxiety, osteoporosis are also affecting patient's functional outcome.    REHAB POTENTIAL: Excellent  CLINICAL DECISION MAKING: Stable/uncomplicated  EVALUATION COMPLEXITY: Low   GOALS: Goals reviewed with patient? Yes  Short term PT Goals (target date for Short term goals are 3 weeks 04/17/2021) Patient will demonstrate independent use of home exercise program to maintain progress from in clinic treatments. Goal status: New   Long term PT goals (target dates for all long term goals are 6 weeks to  05/12/2021  ) Patient will demonstrate/report pain at worst less than or equal to 2/10 to facilitate minimal  limitation in daily activity secondary to pain symptoms. Goal status: New  Patient will demonstrate independent use of home exercise program to facilitate ability to maintain/progress functional gains from skilled physical therapy services. Goal status: New  Patient will demonstrate FOTO outcome > or = 62 % to indicate reduced disability due to condition. Goal status: New  Pt will be able to amb community surfaces for >/= 20 minutes with pain </= 2/10 in Rt knee.  Goal status: New      5.  Pt will improve her Rt knee flexion AROM to >/= 130 degrees.  Goal status: New   PLAN: PT FREQUENCY: 1-2x/week  PT DURATION: 6 weeks  PLANNED INTERVENTIONS:  Therapeutic exercises, Therapeutic activity, Neuro Muscular re-education, Balance training,  Gait training, Patient/Family education, Joint mobilization, Stair training, DME instructions, Dry Needling, Electrical stimulation, Cryotherapy, Moist heat, Taping, Ultrasound, Ionotophoresis '4mg'$ /ml Dexamethasone, and Manual therapy.  All included unless contraindicated  PLAN FOR NEXT SESSION: bike, LE strengthening, ROM    Oretha Caprice, PT, MPT 03/27/2021, 1:02 PM

## 2021-03-28 ENCOUNTER — Other Ambulatory Visit: Payer: Self-pay | Admitting: Internal Medicine

## 2021-03-30 DIAGNOSIS — I788 Other diseases of capillaries: Secondary | ICD-10-CM | POA: Diagnosis not present

## 2021-03-30 DIAGNOSIS — D1801 Hemangioma of skin and subcutaneous tissue: Secondary | ICD-10-CM | POA: Diagnosis not present

## 2021-03-30 DIAGNOSIS — L821 Other seborrheic keratosis: Secondary | ICD-10-CM | POA: Diagnosis not present

## 2021-03-30 DIAGNOSIS — L853 Xerosis cutis: Secondary | ICD-10-CM | POA: Diagnosis not present

## 2021-03-30 DIAGNOSIS — L814 Other melanin hyperpigmentation: Secondary | ICD-10-CM | POA: Diagnosis not present

## 2021-04-10 NOTE — Therapy (Addendum)
?OUTPATIENT PHYSICAL THERAPY LOWER EXTREMITY ?TREATMENT NOTE ? ? ?Patient Name: Nicole Mercer ?MRN: [id number] ?DOB:[birth year], 76 y.o., female ?Today's Date: 04/11/2021 ? ? PT End of Session - 04/11/21 0857   ? ? Visit Number 2   ? Number of Visits 7   ? Date for PT Re-Evaluation 05/12/21   ? PT Start Time [phone]   ? PT Stop Time 0930   ? PT Time Calculation (min) 40 min   ? Activity Tolerance Patient tolerated treatment well   ? Behavior During Therapy [facility] for tasks assessed/performed   ? ?  ?  ? ?  ? ? ? ?Past Medical History:  ?Diagnosis Date  ? Allergy   ? Anxiety   ? Cataract   ? Diverticulosis   ? Esophageal spasm   ? GERD (gastroesophageal reflux disease)   ? Helicobacter pylori gastritis   ? Osteoporosis   ? Urticaria   ? ?Past Surgical History:  ?Procedure Laterality Date  ? COLONOSCOPY    ? ESOPHAGOGASTRODUODENOSCOPY    ? ROTATOR CUFF REPAIR  2021  ? SIGMOIDOSCOPY    ? UPPER GASTROINTESTINAL ENDOSCOPY    ? ?Patient Active Problem List  ? Diagnosis Date Noted  ? ESOPHAGEAL SPASM 07/29/2007  ? GERD 07/29/2007  ? DIVERTICULOSIS OF COLON 07/29/2007  ? ? ?PCP: Josetta Huddle, MD ? ?REFERRING PROVIDER: Mcarthur Rossetti, MD ? ?REFERRING DIAG: J19.147W (ICD-10-CM) - Acute lateral meniscus tear of right knee, subsequent encounter ? ?THERAPY DIAG:  ?Acute pain of right knee ? ?Difficulty in walking, not elsewhere classified ? ?Localized edema ? ? ? ?ONSET DATE: October 2022, March 02/2021 s/p lateral meniscus  ? ?SUBJECTIVE:  ? ?SUBJECTIVE STATEMENT: ?Pt stating compliance with her HEP. Pt reporting 6/10 pain which has been constant. Pt reporting improved sleeping due to pillows propped for stabilization.  ? ?PERTINENT HISTORY: ?anxiety, osteoporosis ? ?PAIN:  ?Are you having pain?  6/10 ?Location: Rt knee  ?Pain relieving: tylenol, ice ?Pain aggravating: bending, prolonged activity ? ?PRECAUTIONS: None ? ?WEIGHT BEARING RESTRICTIONS No ? ?FALLS:  ?Has patient fallen in last 6 months? No, Number of falls:  0 ? ?LIVING ENVIRONMENT: ?Lives with: lives alone ?Lives in: House/apartment ?Stairs: Yes; Internal: 15 steps; on left going up ?Has following equipment at home: None ? ?OCCUPATION: retail sales ? ?PLOF: Independent ? ?PATIENT GOALS Stop hurting, back to work without pain ? ? ?OBJECTIVE:  ? ?DIAGNOSTIC FINDINGS: MRI ? ?PATIENT SURVEYS:  ?03/27/2021: FOTO: 41 % (predicted 62%) ? ?COGNITION: ? Overall cognitive status: Within functional limits for tasks assessed   ?  ?SENSATION: ?Light touch: WFL ? ?MUSCLE LENGTH: ?Hamstrings: Right 80 deg; Left 90 deg ? ? ?POSTURE:  ?Rounded shoulders ? ?PALPATION: ? TTP: around anterior medial joint line.  ? ?LE ROM: ? ?Active ROM Right ?03/27/2021 Left ?03/27/2021 Rt ?04/11/2021 ?Passive  ?Hip flexion     ?Hip extension     ?Hip abduction     ?Hip adduction     ?Hip internal rotation     ?Hip external rotation     ?Knee flexion 110 138 115  ?Knee extension -5 0 0  ?Ankle dorsiflexion     ?Ankle plantarflexion     ?Ankle inversion     ?Ankle eversion     ? (Blank rows = not tested) ? ?LE MMT: ? ?MMT Right ?03/27/2021 Left ?03/27/2021  ?Hip flexion 5/5 5/5  ?Hip extension    ?Hip abduction 5/5 5/5  ?Hip adduction 5/5 5/5  ?Hip internal rotation    ?  Hip external rotation    ?Knee flexion 3/5 5/5  ?Knee extension 3/5 5/5  ?Ankle dorsiflexion    ?Ankle plantarflexion    ?Ankle inversion    ?Ankle eversion    ? (Blank rows = not tested) ? ? ? ?FUNCTIONAL TESTS:  ?5 times sit to stand: 20 seconds with UE support ? ?GAIT: ?Distance walked: 50 feet ?Assistive device utilized: None ?Level of assistance: Complete Independence ?Comments: step through gait pattern ? ? ? ?TODAY'S TREATMENT: ?04/11/2021:  ?TherEx: ? Supine: Bridges x 10 holding 5 seconds ?Seated: SLR x10, SLR x 10 with ER ?Aerobic: recumbent bike x 8 minutes ?Leg Press: 50# bilateral LE's 2 x10, Rt LE only: 25# 2x10 ?Step ups 4 inch step x 15 no UE support ?Hip flexor stretch in lunge position x 2 holding 20 seconds each ?Modalities:   ?Vasopneumatic: x 10 minutes, medium compression 34 degrees.  ? ?     ?03/27/2021:  ?TherEx: ? Supine: Bridges x 10 holding 5 seconds ?        SLR: x 10 ?       SLR with ER x 10 ?       QS x 10 ? Seated: sit to stand x 5 ? Aerobic: recumbent bike x 8 minutes ?     ? ?PATIENT EDUCATION:  ?Education details: pt ?Person educated: Patient ?Education method: Explanation, Demonstration, Tactile cues, and Verbal cues ?Education comprehension: verbalized understanding and returned demonstration ? ? ?HOME EXERCISE PROGRAM: ?Access Code: [email or web address] ?URL: [email or web address] ?Date: 04/11/2021 ?Prepared by: Kearney Hard ? ?Exercises ?- Supine Active Straight Leg Raise  - 3 x daily - 7 x weekly - 2 sets - 10 reps ?- Straight Leg Raise with External Rotation  - 3 x daily - 7 x weekly - 2 sets - 10 reps ?- Supine Bridge  - 3 x daily - 7 x weekly - 2 sets - 10 reps ?- Supine Quad Set  - 3 x daily - 7 x weekly - 2 sets - 10 reps ?- Sit to Stand  - 3 x daily - 7 x weekly - 2 sets - 10 reps ?- Standing Hip Flexor Stretch  - 2 x daily - 7 x weekly - 2-3 reps - 30 seconds hold ?- Standing Gastroc Stretch on Foam 1/2 Roll  - 2 x daily - 7 x weekly - 2-3 reps - 20-30 seconds hold ?- Full Leg Press  - 1 x daily - 7 x weekly - 2 sets - 10 reps ?- Recumbent Bike  - 1 x daily ? ? ?ASSESSMENT: ? ?CLINICAL IMPRESSION: ?04/11/2021:  ?Pt tolerating exercises well today. Pt arriving to therapy reporting 6/10 pain in her Rt knee. Pt stating after treatment today her pain was a little less and her stiffness improved to 5/10. We discussed gym program for beginning bike and leg press as well as mini squats for home. HEP updated. Continue skilled PT.  ? ? ? ? ?Patient is a 76 y.o. who comes to clinic with complaints of right knee pain s/p lateral meniscus repair on 03/16/2021. Pt arriving with mobility, strength and movement coordination deficits that impair their ability to perform usual daily and recreational functional activities  without increase difficulty/symptoms at this time.  Patient to benefit from skilled PT services to address impairments and limitations to improve to previous level of function without restriction secondary to condition.  ? ? ?OBJECTIVE IMPAIRMENTS Abnormal gait, decreased activity tolerance, decreased balance, decreased mobility, difficulty walking, decreased ROM, increased edema,  and pain.  ? ?ACTIVITY LIMITATIONS community activity and occupation.  ? ?PERSONAL FACTORS anxiety, osteoporosis are also affecting patient's functional outcome.  ? ? ?REHAB POTENTIAL: Excellent ? ?CLINICAL DECISION MAKING: Stable/uncomplicated ? ?EVALUATION COMPLEXITY: Low ? ? ?GOALS: ?Goals reviewed with patient? Yes ? ?Short term PT Goals (target date for Short term goals are 3 weeks 04/17/2021) ?Patient will demonstrate independent use of home exercise program to maintain progress from in clinic treatments. ?Goal status: ongoing 04/11/2021 ?  ?Long term PT goals (target dates for all long term goals are 6 weeks to  05/12/2021  ) ?Patient will demonstrate/report pain at worst less than or equal to 2/10 to facilitate minimal limitation in daily activity secondary to pain symptoms. ?Goal status: New ? ?Patient will demonstrate independent use of home exercise program to facilitate ability to maintain/progress functional gains from skilled physical therapy services. ?Goal status: New ? ?Patient will demonstrate FOTO outcome > or = 62 % to indicate reduced disability due to condition. ?Goal status: New ? ?Pt will be able to amb community surfaces for >/= 20 minutes with pain </= 2/10 in Rt knee.  ?Goal status: New ? ?    5.  Pt will improve her Rt knee flexion AROM to >/= 130 degrees.  ?Goal status: New ? ? ?PLAN: ?PT FREQUENCY: 1-2x/week ? ?PT DURATION: 6 weeks ? ?PLANNED INTERVENTIONS:  ?Therapeutic exercises, Therapeutic activity, Neuro Muscular re-education, Balance training, Gait training, Patient/Family education, Joint mobilization,  Stair training, DME instructions, Dry Needling, Electrical stimulation, Cryotherapy, Moist heat, Taping, Ultrasound, Ionotophoresis '4mg'$ /ml Dexamethasone, and Manual therapy.  All included unless contraindicated ? ?PLAN

## 2021-04-11 ENCOUNTER — Encounter: Payer: Self-pay | Admitting: Physical Therapy

## 2021-04-11 ENCOUNTER — Other Ambulatory Visit: Payer: Self-pay

## 2021-04-11 ENCOUNTER — Ambulatory Visit (INDEPENDENT_AMBULATORY_CARE_PROVIDER_SITE_OTHER): Payer: Medicare Other | Admitting: Physical Therapy

## 2021-04-11 DIAGNOSIS — R600 Localized edema: Secondary | ICD-10-CM | POA: Diagnosis not present

## 2021-04-11 DIAGNOSIS — M25561 Pain in right knee: Secondary | ICD-10-CM

## 2021-04-11 DIAGNOSIS — R262 Difficulty in walking, not elsewhere classified: Secondary | ICD-10-CM | POA: Diagnosis not present

## 2021-04-11 DIAGNOSIS — R29898 Other symptoms and signs involving the musculoskeletal system: Secondary | ICD-10-CM

## 2021-04-11 DIAGNOSIS — M25562 Pain in left knee: Secondary | ICD-10-CM | POA: Diagnosis not present

## 2021-04-17 ENCOUNTER — Ambulatory Visit (INDEPENDENT_AMBULATORY_CARE_PROVIDER_SITE_OTHER): Payer: Medicare Other | Admitting: Physical Therapy

## 2021-04-17 ENCOUNTER — Encounter: Payer: Self-pay | Admitting: Physical Therapy

## 2021-04-17 DIAGNOSIS — R262 Difficulty in walking, not elsewhere classified: Secondary | ICD-10-CM | POA: Diagnosis not present

## 2021-04-17 DIAGNOSIS — M25561 Pain in right knee: Secondary | ICD-10-CM | POA: Diagnosis not present

## 2021-04-17 DIAGNOSIS — R600 Localized edema: Secondary | ICD-10-CM | POA: Diagnosis not present

## 2021-04-17 DIAGNOSIS — M25562 Pain in left knee: Secondary | ICD-10-CM

## 2021-04-17 NOTE — Therapy (Addendum)
?OUTPATIENT PHYSICAL THERAPY LOWER EXTREMITY  ?Treatment Note ? ? ?Patient Name: Nicole Mercer ?MRN: [id number] ?DOB:03/04/1945, 76 y.o., female ?Today's Date: 04/17/2021 ? ? PT End of Session - 04/17/21 1246   ? ? Visit Number 3   ? Number of Visits 7   ? Date for PT Re-Evaluation 05/12/21   ? PT Start Time 1150   ? PT Stop Time 1230   ? PT Time Calculation (min) 40 min   ? Activity Tolerance Patient tolerated treatment well   ? Behavior During Therapy [facility] for tasks assessed/performed   ? ?  ?  ? ?  ? ? ? ? ?Past Medical History:  ?Diagnosis Date  ? Allergy   ? Anxiety   ? Cataract   ? Diverticulosis   ? Esophageal spasm   ? GERD (gastroesophageal reflux disease)   ? Helicobacter pylori gastritis   ? Osteoporosis   ? Urticaria   ? ?Past Surgical History:  ?Procedure Laterality Date  ? COLONOSCOPY    ? ESOPHAGOGASTRODUODENOSCOPY    ? ROTATOR CUFF REPAIR  2021  ? SIGMOIDOSCOPY    ? UPPER GASTROINTESTINAL ENDOSCOPY    ? ?Patient Active Problem List  ? Diagnosis Date Noted  ? ESOPHAGEAL SPASM 07/29/2007  ? GERD 07/29/2007  ? DIVERTICULOSIS OF COLON 07/29/2007  ? ? ?PCP: Josetta Huddle, MD ? ?REFERRING PROVIDER: Mcarthur Rossetti, MD ? ?REFERRING DIAG: B34.193X (ICD-10-CM) - Acute lateral meniscus tear of right knee, subsequent encounter ? ?THERAPY DIAG:  ?Acute pain of right knee ? ?Difficulty in walking, not elsewhere classified ? ?Localized edema ? ?ONSET DATE: October 2022, March 02/2021 s/p lateral meniscus  ? ?SUBJECTIVE:  ? ?SUBJECTIVE STATEMENT: ?Pt stating she didn't exercise yesterday due to over doing it on Saturday. Pt stating, "it feels like there is a cinder block in her knee".   ? ?PERTINENT HISTORY: ?anxiety, osteoporosis ? ?PAIN:  ?Are you having pain?  8/10, pt reporting her pain can increase to 10/10 at times with bending or squating ?Location: Rt knee  ?Pain relieving: tylenol, ice ?Pain aggravating: bending, prolonged activity ? ?PRECAUTIONS: None ? ?WEIGHT BEARING RESTRICTIONS No ? ?FALLS:  ?Has  patient fallen in last 6 months? No, Number of falls: 0 ? ?LIVING ENVIRONMENT: ?Lives with: lives alone ?Lives in: House/apartment ?Stairs: Yes; Internal: 15 steps; on left going up ?Has following equipment at home: None ? ?OCCUPATION: retail sales ? ?PLOF: Independent ? ?PATIENT GOALS Stop hurting, back to work without pain ? ? ?OBJECTIVE:  ? ?DIAGNOSTIC FINDINGS: MRI ? ?PATIENT SURVEYS:  ?03/27/2021: FOTO: 41 % (predicted 62%) ? ?COGNITION: ? Overall cognitive status: Within functional limits for tasks assessed   ?  ?SENSATION: ?Light touch: WFL ? ?MUSCLE LENGTH: ?Hamstrings: Right 80 deg; Left 90 deg ? ? ?POSTURE:  ?Rounded shoulders ? ?PALPATION: ? TTP: around anterior medial joint line.  ?  ? EDEMA:  ? 04/17/2021:  ?                   Rt knee: 45.5 centimeters circumference around mid patella ?    Left knee: 43 centimeters circumference around mid patella ? ?LE ROM: ? ?Active ROM Right ?03/27/2021 Left ?03/27/2021 Rt ?04/11/2021 ?Passive Rt ?04/17/2021 ?passive  ?Hip flexion      ?Hip extension      ?Hip abduction      ?Hip adduction      ?Hip internal rotation      ?Hip external rotation      ?Knee flexion 110 138 115  116  ?Knee extension -5 0 0 0  ?Ankle dorsiflexion      ?Ankle plantarflexion      ?Ankle inversion      ?Ankle eversion      ? (Blank rows = not tested) ? ?LE MMT: ? ?MMT Right ?03/27/2021 Left ?03/27/2021  ?Hip flexion 5/5 5/5  ?Hip extension    ?Hip abduction 5/5 5/5  ?Hip adduction 5/5 5/5  ?Hip internal rotation    ?Hip external rotation    ?Knee flexion 3/5 5/5  ?Knee extension 3/5 5/5  ?Ankle dorsiflexion    ?Ankle plantarflexion    ?Ankle inversion    ?Ankle eversion    ? (Blank rows = not tested) ? ? ? ?FUNCTIONAL TESTS:  ?5 times sit to stand: 20 seconds with UE support ? ?GAIT: ?Distance walked: 50 feet ?Assistive device utilized: None ?Level of assistance: Complete Independence ?Comments: step through gait pattern ? ? ? ?TODAY'S TREATMENT: ?04/17/2021:  ?TherEx: ? Supine: Bridges x 10 holding 5  seconds ?Seated: SLR x10, SLR x 10 with ER ?Aerobic: recumbent scifit bike x 6 minutes ?Leg Press: 50# bilateral LE's 2 x10, Rt LE only: 25# 2x10 ?Step ups 4 inch step x 15 no UE support ?SLS on Airex Mat x 1 minute with UE support and tap downs with her Left LE.  ?Hip flexor stretch in lunge position x 2 holding 20 seconds each ?Modalities:  ?Vasopneumatic: x 10 minutes, medium compression 34 degrees.  ? ?04/11/2021:  ?TherEx: ? Sit to stand x 15  ? Rocker Board: x 2 minutes with UE support ?Standing hip abduction x 15 each LE ?Aerobic: recumbent bike x 8 minutes ?Leg Press: 50# bilateral LE's 2 x10, Rt LE only: 25# 2x10 ?Step ups 6 inch step x 15 no UE support ?Lateral step ups x 15 on 6 inch step with UE support ? ? ?Modalities:  ?Vasopneumatic: x 10 minutes, medium compression 34 degrees.  ? ?     ?03/27/2021:  ?TherEx: ? Supine: Bridges x 10 holding 5 seconds ?        SLR: x 10 ?       SLR with ER x 10 ?       QS x 10 ? Seated: sit to stand x 5 ? Aerobic: recumbent bike x 8 minutes ?     ? ?PATIENT EDUCATION:  ?Education details: pt ?Person educated: Patient ?Education method: Explanation, Demonstration, Tactile cues, and Verbal cues ?Education comprehension: verbalized understanding and returned demonstration ? ? ?HOME EXERCISE PROGRAM: ?Access Code: [email or web address] ?URL: [email or web address] ?Date: 04/17/2021 ?Prepared by: Kearney Hard ? ?Exercises ?- Supine Active Straight Leg Raise  - 3 x daily - 7 x weekly - 2 sets - 10 reps ?- Straight Leg Raise with External Rotation  - 3 x daily - 7 x weekly - 2 sets - 10 reps ?- Supine Bridge  - 3 x daily - 7 x weekly - 2 sets - 10 reps ?- Supine Quad Set  - 3 x daily - 7 x weekly - 2 sets - 10 reps ?- Sit to Stand  - 3 x daily - 7 x weekly - 2 sets - 10 reps ?- Standing Hip Flexor Stretch  - 2 x daily - 7 x weekly - 2-3 reps - 30 seconds hold ?- Standing Gastroc Stretch on Foam 1/2 Roll  - 2 x daily - 7 x weekly - 2-3 reps - 20-30 seconds hold ?- Full Leg  Press  - 1 x daily -  7 x weekly - 2 sets - 10 reps ?- Recumbent Bike  - 1 x daily ?- Step Up  - 1 x daily - 7 x weekly - 15 reps ?- Lateral Step Up  - 1 x daily - 7 x weekly - 15 reps ?- Standing Hip Abduction with Counter Support  - 1 x daily - 7 x weekly - 15 reps ? ? ?ASSESSMENT: ? ?CLINICAL IMPRESSION: ?04/17/2021:  ?Pt arriving reporting increased stiffness and increased pain up to 10/10 at times. Pt has been consistent with her HEP and is going to the gym daily and working on the bike. Pt feels doing the leg press at the gym may be too much weight and we discussed holding off on the leg press to see if her pain lessens. Pt Passive Rt knee ROM improved to 116 degrees. Pt with noted 2.5 centimeters of swelling noted between Rt and Left mid patella. Pt's HEP was updated to add step ups. Continue skilled PT to progress toward maximizing function.  ? ? ?04/11/2021:  ?Pt tolerating exercises well today. Pt arriving to therapy reporting 6/10 pain in her Rt knee. Pt stating after treatment today her pain was a little less and her stiffness improved to 5/10. We discussed gym program for beginning bike and leg press as well as mini squats for home. HEP updated. Continue skilled PT.  ? ? ? ? ?Patient is a 76 y.o. who comes to clinic with complaints of right knee pain s/p lateral meniscus repair on 03/16/2021. Pt arriving with mobility, strength and movement coordination deficits that impair their ability to perform usual daily and recreational functional activities without increase difficulty/symptoms at this time.  Patient to benefit from skilled PT services to address impairments and limitations to improve to previous level of function without restriction secondary to condition.  ? ? ?OBJECTIVE IMPAIRMENTS Abnormal gait, decreased activity tolerance, decreased balance, decreased mobility, difficulty walking, decreased ROM, increased edema, and pain.  ? ?ACTIVITY LIMITATIONS community activity and occupation.  ? ?PERSONAL  FACTORS anxiety, osteoporosis are also affecting patient's functional outcome.  ? ? ?REHAB POTENTIAL: Excellent ? ?CLINICAL DECISION MAKING: Stable/uncomplicated ? ?EVALUATION COMPLEXITY: Low ? ? ?GOALS: ?G

## 2021-04-18 ENCOUNTER — Encounter: Payer: Federal, State, Local not specified - PPO | Admitting: Physical Therapy

## 2021-04-20 ENCOUNTER — Telehealth: Payer: Self-pay

## 2021-04-20 ENCOUNTER — Other Ambulatory Visit: Payer: Self-pay

## 2021-04-20 DIAGNOSIS — Z9889 Other specified postprocedural states: Secondary | ICD-10-CM

## 2021-04-20 NOTE — Telephone Encounter (Signed)
Called pt and advised that we need to start with an ultrasound to r/o DVT due to swelling getting worse and is going into her calf. She was advised to ice and elevate to help reduce swelling. Pt stated understanding  ? ?FYI-She is scheduled for this tomorrow @ 8am. ?

## 2021-04-20 NOTE — Telephone Encounter (Signed)
Patient called stating that her right leg is swollen from her right knee down to her foot.  Stated that it's worse today then it was yesterday.  Patient had right knee surgery last month.  Would like a CB to discuss?  Cb# [phone].  Please advise.  Thank you. ?

## 2021-04-21 ENCOUNTER — Ambulatory Visit (HOSPITAL_COMMUNITY)
Admission: RE | Admit: 2021-04-21 | Discharge: 2021-04-21 | Disposition: A | Discharge status: Home / Self Care | Payer: Medicare Other | Source: Ambulatory Visit | Attending: Orthopaedic Surgery | Admitting: Orthopaedic Surgery

## 2021-04-21 DIAGNOSIS — Z9889 Other specified postprocedural states: Secondary | ICD-10-CM | POA: Insufficient documentation

## 2021-04-24 ENCOUNTER — Ambulatory Visit (INDEPENDENT_AMBULATORY_CARE_PROVIDER_SITE_OTHER): Payer: Medicare Other | Admitting: Rehabilitative and Restorative Service Providers"

## 2021-04-24 ENCOUNTER — Encounter: Payer: Self-pay | Admitting: Orthopaedic Surgery

## 2021-04-24 ENCOUNTER — Ambulatory Visit (INDEPENDENT_AMBULATORY_CARE_PROVIDER_SITE_OTHER): Payer: Federal, State, Local not specified - PPO | Admitting: Orthopaedic Surgery

## 2021-04-24 ENCOUNTER — Telehealth: Payer: Self-pay

## 2021-04-24 ENCOUNTER — Encounter: Payer: Self-pay | Admitting: Rehabilitative and Restorative Service Providers"

## 2021-04-24 DIAGNOSIS — M25561 Pain in right knee: Secondary | ICD-10-CM | POA: Diagnosis not present

## 2021-04-24 DIAGNOSIS — R600 Localized edema: Secondary | ICD-10-CM

## 2021-04-24 DIAGNOSIS — Z9889 Other specified postprocedural states: Secondary | ICD-10-CM

## 2021-04-24 DIAGNOSIS — R262 Difficulty in walking, not elsewhere classified: Secondary | ICD-10-CM

## 2021-04-24 NOTE — Telephone Encounter (Signed)
Right knee gel injection  

## 2021-04-24 NOTE — Therapy (Addendum)
?OUTPATIENT PHYSICAL THERAPY LOWER EXTREMITY  ?TREATMENT NOTE ? ? ?Patient Name: Nicole Mercer ?MRN: [id number] ?DOB:January 31, 1945, 76 y.o., female ?Today's Date: 04/24/2021 ? ? PT End of Session - 04/24/21 1236   ? ? Visit Number 4   ? Number of Visits 7   ? Date for PT Re-Evaluation 05/12/21   ? PT Start Time 1123   ? PT Stop Time 1147   ? PT Time Calculation (min) 24 min   ? Activity Tolerance Patient tolerated treatment well   ? Behavior During Therapy [facility] for tasks assessed/performed   ? ?  ?  ? ?  ? ? ? ? ? ?Past Medical History:  ?Diagnosis Date  ? Allergy   ? Anxiety   ? Cataract   ? Diverticulosis   ? Esophageal spasm   ? GERD (gastroesophageal reflux disease)   ? Helicobacter pylori gastritis   ? Osteoporosis   ? Urticaria   ? ?Past Surgical History:  ?Procedure Laterality Date  ? COLONOSCOPY    ? ESOPHAGOGASTRODUODENOSCOPY    ? ROTATOR CUFF REPAIR  2021  ? SIGMOIDOSCOPY    ? UPPER GASTROINTESTINAL ENDOSCOPY    ? ?Patient Active Problem List  ? Diagnosis Date Noted  ? ESOPHAGEAL SPASM 07/29/2007  ? GERD 07/29/2007  ? DIVERTICULOSIS OF COLON 07/29/2007  ? ? ?PCP: Josetta Huddle, MD ? ?REFERRING PROVIDER: Mcarthur Rossetti, MD ? ?REFERRING DIAG: A19.379K (ICD-10-CM) - Acute lateral meniscus tear of right knee, subsequent encounter ? ?THERAPY DIAG:  ?Acute pain of right knee ? ?Localized edema ? ?Difficulty in walking, not elsewhere classified ? ?ONSET DATE: October 2022, March 02/2021 s/p lateral meniscus  ? ?SUBJECTIVE:  ? ?SUBJECTIVE STATEMENT: ?Started session a bit late due to her appointment with Dr. Ninfa Linden running over.  Nicole Mercer is concerned about the "heaviness" (weakness) of her R leg and late day edema and (mostly) pain. ? ?PERTINENT HISTORY: ?anxiety, osteoporosis ? ?PAIN:  ?Are you having pain?  Can be 8/10. ?Location: Rt knee  ?Pain relieving: tylenol, ice ?Pain aggravating: bending, prolonged activity ? ?PRECAUTIONS: None ? ?WEIGHT BEARING RESTRICTIONS No ? ?FALLS:  ?Has patient fallen in last 6  months? No, Number of falls: 0 ? ?LIVING ENVIRONMENT: ?Lives with: lives alone ?Lives in: House/apartment ?Stairs: Yes; Internal: 15 steps; on left going up ?Has following equipment at home: None ? ?OCCUPATION: retail sales ? ?PLOF: Independent ? ?PATIENT GOALS Stop hurting, back to work without pain ? ? ?OBJECTIVE:  ? ?DIAGNOSTIC FINDINGS: MRI ? ?PATIENT SURVEYS:  ?03/27/2021: FOTO: 41 % (predicted 62%) ? ?COGNITION: ? Overall cognitive status: Within functional limits for tasks assessed   ?  ?SENSATION: ?Light touch: WFL ? ?MUSCLE LENGTH: ?Hamstrings: Right 80 deg; Left 90 deg ? ? ?POSTURE:  ?Rounded shoulders ? ?PALPATION: ? TTP: around anterior medial joint line.  ?  ? EDEMA:  ? 04/17/2021:  ?                   Rt knee: 45.5 centimeters circumference around mid patella ?    Left knee: 43 centimeters circumference around mid patella ? ?LE ROM: ? ?Active ROM Right ?03/27/2021 Left ?03/27/2021 Rt ?04/11/2021 ?Passive Rt ?04/17/2021 ?passive  ?Hip flexion      ?Hip extension      ?Hip abduction      ?Hip adduction      ?Hip internal rotation      ?Hip external rotation      ?Knee flexion 110 138 115 116  ?Knee extension -5 0  0 0  ?Ankle dorsiflexion      ?Ankle plantarflexion      ?Ankle inversion      ?Ankle eversion      ? (Blank rows = not tested) ? ?LE MMT: ? ?MMT Right ?03/27/2021 Left ?03/27/2021  ?Hip flexion 5/5 5/5  ?Hip extension    ?Hip abduction 5/5 5/5  ?Hip adduction 5/5 5/5  ?Hip internal rotation    ?Hip external rotation    ?Knee flexion 3/5 5/5  ?Knee extension 3/5 5/5  ?Ankle dorsiflexion    ?Ankle plantarflexion    ?Ankle inversion    ?Ankle eversion    ? (Blank rows = not tested) ? ? ? ?FUNCTIONAL TESTS:  ?5 times sit to stand: 20 seconds with UE support ? ?GAIT: ?Distance walked: 50 feet ?Assistive device utilized: None ?Level of assistance: Complete Independence ?Comments: step through gait pattern ? ? ? ?TODAY'S TREATMENT: ?04/24/2021: ?Therapeutic Exercise: Seated straight leg raises 4 sets of 5 for 3  seconds, slow eccentrics for quadriceps activation in NWB ? ?Therapeutic Activities: Discussed knee anatomy with model, reviewed imaging, surgery and expectations post-surgery ? ? ?04/17/2021: ?TherEx: ? Supine: Bridges x 10 holding 5 seconds ?Seated: SLR x10, SLR x 10 with ER ?Aerobic: recumbent scifit bike x 6 minutes ?Leg Press: 50# bilateral LE's 2 x10, Rt LE only: 25# 2x10 ?Step ups 4 inch step x 15 no UE support ?SLS on Airex Mat x 1 minute with UE support and tap downs with her Left LE.  ?Hip flexor stretch in lunge position x 2 holding 20 seconds each ?Modalities:  ?Vasopneumatic: x 10 minutes, medium compression 34 degrees.  ? ? ?04/11/2021:  ?TherEx: ? Sit to stand x 15  ? Rocker Board: x 2 minutes with UE support ?Standing hip abduction x 15 each LE ?Aerobic: recumbent bike x 8 minutes ?Leg Press: 50# bilateral LE's 2 x10, Rt LE only: 25# 2x10 ?Step ups 6 inch step x 15 no UE support ?Lateral step ups x 15 on 6 inch step with UE support ? ? ?Modalities:  ?Vasopneumatic: x 10 minutes, medium compression 34 degrees.  ? ?     ?PATIENT EDUCATION:  ?Education details: pt ?Person educated: Patient ?Education method: Explanation, Demonstration, Tactile cues, and Verbal cues ?Education comprehension: verbalized understanding and returned demonstration ? ? ?HOME EXERCISE PROGRAM: ?Access Code: [email or web address] ?URL: [email or web address] ?Date: 04/17/2021 ?Prepared by: Kearney Hard ? ?Exercises ?- Supine Active Straight Leg Raise  - 3 x daily - 7 x weekly - 2 sets - 10 reps ?- Straight Leg Raise with External Rotation  - 3 x daily - 7 x weekly - 2 sets - 10 reps ?- Supine Bridge  - 3 x daily - 7 x weekly - 2 sets - 10 reps ?- Supine Quad Set  - 3 x daily - 7 x weekly - 2 sets - 10 reps ?- Sit to Stand  - 3 x daily - 7 x weekly - 2 sets - 10 reps ?- Standing Hip Flexor Stretch  - 2 x daily - 7 x weekly - 2-3 reps - 30 seconds hold ?- Standing Gastroc Stretch on Foam 1/2 Roll  - 2 x daily - 7 x weekly - 2-3  reps - 20-30 seconds hold ?- Full Leg Press  - 1 x daily - 7 x weekly - 2 sets - 10 reps ?- Recumbent Bike  - 1 x daily ?- Step Up  - 1 x daily - 7 x weekly - 15 reps ?-  Lateral Step Up  - 1 x daily - 7 x weekly - 15 reps ?- Standing Hip Abduction with Counter Support  - 1 x daily - 7 x weekly - 15 reps ? ?Added 30-50/day seated SLR with slow eccentrics to HEP ? ?ASSESSMENT: ? ?CLINICAL IMPRESSION: ?04/24/2021: ?Athanasia reports early frustration with her perceived lack of progress.  We reviewed her imaging, knee anatomy and discussed expected healing times.  We discussed the importance of focusing on strengthening the primary shock absorber of her knee joint (quadriceps) and added an activity to her HEP to complement her existing program.  Her prognosis is good with continued strength gains and time for normal healing. ? ? ?04/17/2021:  ?Pt arriving reporting increased stiffness and increased pain up to 10/10 at times. Pt has been consistent with her HEP and is going to the gym daily and working on the bike. Pt feels doing the leg press at the gym may be too much weight and we discussed holding off on the leg press to see if her pain lessens. Pt's HEP was updated to add step ups. Continue skilled PT to progress toward maximizing function.  ? ? ?04/11/2021:  ?Pt tolerating exercises well today. Pt arriving to therapy reporting 6/10 pain in her Rt knee. Pt stating after treatment today her pain was a little less and her stiffness improved to 5/10. We discussed gym program for beginning bike and leg press as well as mini squats for home. HEP updated. Continue skilled PT.  ? ? ? ? ?Patient is a 76 y.o. who comes to clinic with complaints of right knee pain s/p lateral meniscus repair on 03/16/2021. Pt arriving with mobility, strength and movement coordination deficits that impair their ability to perform usual daily and recreational functional activities without increase difficulty/symptoms at this time.  Patient to benefit  from skilled PT services to address impairments and limitations to improve to previous level of function without restriction secondary to condition.  ? ? ?OBJECTIVE IMPAIRMENTS Abnormal gait, decreased ac

## 2021-04-24 NOTE — Progress Notes (Signed)
The patient is now 6 weeks status post a right knee arthroscopy.  She said the knee is still sore stiff and swollen.  She has been going through physical therapy.  When I saw her at her last visit I did aspirate about 20 cc of fluid off the knee and placed a steroid in the knee. ? ?The knee is still slightly warm today and there is still a mild effusion.  Her range of motion is full.  A recent Doppler ultrasound showed no evidence of a DVT.  She is still concerned about the pain that she is having which is certainly appropriate pain to have postarthroscopy having the extent of work that she had in her knee.  There was no exposed cartilage but the cartilage is thinning. ? ?She is definitely a candidate for hyaluronic acid given the osteoarthritis of her right knee.  We will see if we can order this for her.  Of note I was not able to aspirate any fluid off of her knee.  I see that there is swelling there but it is not worrisome from my standpoint.  She however is very frustrated that she is not feeling better and said the knee does feel heavy.  Can only give her reassurance that this is normal after arthroscopic interventions of the knee and that she should keep pushing through this.  I do feel that hyaluronic acid may help given the fact that we did find grade III chondromalacia of the medial compartment of the knee. ?

## 2021-04-28 NOTE — Telephone Encounter (Signed)
Noted  

## 2021-05-01 ENCOUNTER — Encounter: Payer: Federal, State, Local not specified - PPO | Admitting: Physical Therapy

## 2021-05-04 ENCOUNTER — Telehealth: Payer: Self-pay | Admitting: Orthopaedic Surgery

## 2021-05-04 ENCOUNTER — Ambulatory Visit (INDEPENDENT_AMBULATORY_CARE_PROVIDER_SITE_OTHER): Payer: Medicare Other | Admitting: Rehabilitative and Restorative Service Providers"

## 2021-05-04 ENCOUNTER — Telehealth: Payer: Self-pay

## 2021-05-04 ENCOUNTER — Encounter: Payer: Self-pay | Admitting: Rehabilitative and Restorative Service Providers"

## 2021-05-04 DIAGNOSIS — R262 Difficulty in walking, not elsewhere classified: Secondary | ICD-10-CM | POA: Diagnosis not present

## 2021-05-04 DIAGNOSIS — R600 Localized edema: Secondary | ICD-10-CM

## 2021-05-04 DIAGNOSIS — M25561 Pain in right knee: Secondary | ICD-10-CM

## 2021-05-04 NOTE — Telephone Encounter (Signed)
Pt is calling regarding gel injections ? ?She also wants to speak with a nurse regarding swelling and warm to touch behind Knee .. ? ? ?

## 2021-05-04 NOTE — Therapy (Signed)
?OUTPATIENT PHYSICAL THERAPY LOWER EXTREMITY  ?TREATMENT NOTE ? ? ?Patient Name: Nicole Mercer ?MRN: [id number] ?DOB:1945-04-24, 76 y.o., female ?Today's Date: 05/04/2021 ? ? PT End of Session - 05/04/21 1352   ? ? Visit Number 5   ? Number of Visits 7   ? Date for PT Re-Evaluation 05/12/21   ? PT Start Time 1342   ? PT Stop Time 1438   ? PT Time Calculation (min) 56 min   ? Activity Tolerance Patient tolerated treatment well;No increased pain   ? Behavior During Therapy Nicole Mercer for tasks assessed/performed   ? ?  ?  ? ?  ? ? ? ? ? ? ?Past Medical History:  ?Diagnosis Date  ? Allergy   ? Anxiety   ? Cataract   ? Diverticulosis   ? Esophageal spasm   ? GERD (gastroesophageal reflux disease)   ? Helicobacter pylori gastritis   ? Osteoporosis   ? Urticaria   ? ?Past Surgical History:  ?Procedure Laterality Date  ? COLONOSCOPY    ? ESOPHAGOGASTRODUODENOSCOPY    ? ROTATOR CUFF REPAIR  2021  ? SIGMOIDOSCOPY    ? UPPER GASTROINTESTINAL ENDOSCOPY    ? ?Patient Active Problem List  ? Diagnosis Date Noted  ? ESOPHAGEAL SPASM 07/29/2007  ? GERD 07/29/2007  ? DIVERTICULOSIS OF COLON 07/29/2007  ? ? ?PCP: Josetta Huddle, MD ? ?REFERRING PROVIDER: Mcarthur Rossetti, MD ? ?REFERRING DIAG: G40.102V (ICD-10-CM) - Acute lateral meniscus tear of right knee, subsequent encounter ? ?THERAPY DIAG:  ?Difficulty in walking, not elsewhere classified ? ?Acute pain of right knee ? ?Localized edema ? ?ONSET DATE: October 2022, March 02/2021 s/p lateral meniscus arthroscopy ? ?SUBJECTIVE:  ? ?SUBJECTIVE STATEMENT: ?Nicole Mercer does feel stronger than last week.   Sleep has been good.  She has been doing 50 seated SLR per day and going to the gym. ? ?PERTINENT HISTORY: ?anxiety, osteoporosis ? ?PAIN:  ?Are you having pain?  2-5/10. ?Location: Rt knee  ?Pain relieving: tylenol, ice ?Pain aggravating: bending, prolonged WB ? ?PRECAUTIONS: None ? ?WEIGHT BEARING RESTRICTIONS No ? ?FALLS:  ?Has patient fallen in last 6 months? No, Number of falls:  0 ? ?LIVING ENVIRONMENT: ?Lives with: lives alone ?Lives in: House/apartment ?Stairs: Yes; Internal: 15 steps; on left going up ?Has following equipment at home: None ? ?OCCUPATION: retail sales ? ?PLOF: Independent ? ?PATIENT GOALS Stop hurting, back to work without pain ? ? ?OBJECTIVE:  ? ?DIAGNOSTIC FINDINGS: MRI ? ?PATIENT SURVEYS:  ?05/04/2021: FOTO: 54 (was 41, Goal 62); ?03/27/2021: FOTO: 41 % (predicted 62%) ? ?COGNITION: ? Overall cognitive status: Within functional limits for tasks assessed   ?  ?SENSATION: ?Light touch: WFL ? ?MUSCLE LENGTH: ?Hamstrings: Right 80 deg; Left 90 deg ? ? ?POSTURE:  ?Rounded shoulders ? ?PALPATION: ? TTP: around anterior medial joint line.  ?  ? EDEMA:  ? 04/17/2021:  ?                   Rt knee: 45.5 centimeters circumference around mid patella ?    Left knee: 43 centimeters circumference around mid patella ? ?LE ROM: ? ?Active ROM Right ?03/27/2021 Left ?03/27/2021 Rt ?04/11/2021 ?Passive Rt ?04/17/2021 ?passive Right 05/04/2021  ?Hip flexion       ?Hip extension       ?Hip abduction       ?Hip adduction       ?Hip internal rotation       ?Hip external rotation       ?  Knee flexion 110 138 115 116 124  ?Knee extension -5 0 0 0 0  ?Ankle dorsiflexion       ?Ankle plantarflexion       ?Ankle inversion       ?Ankle eversion       ? (Blank rows = not tested) ? ?LE MMT: ? ?MMT Right ?03/27/2021 Left ?03/27/2021  ?Hip flexion 5/5 5/5  ?Hip extension    ?Hip abduction 5/5 5/5  ?Hip adduction 5/5 5/5  ?Hip internal rotation    ?Hip external rotation    ?Knee flexion 3/5 5/5  ?Knee extension 3/5 5/5  ?Ankle dorsiflexion    ?Ankle plantarflexion    ?Ankle inversion    ?Ankle eversion    ? (Blank rows = not tested) ? ? ? ?FUNCTIONAL TESTS:  ?5 times sit to stand: 20 seconds with UE support ? ?GAIT: ?Distance walked: 50 feet ?Assistive device utilized: None ?Level of assistance: Complete Independence ?Comments: step through gait pattern ? ? ? ?TODAY'S TREATMENT: ?05/04/2021: ?Therapeutic Exercise:  Seated straight leg raises 4 sets of 5 for 3 seconds, slow eccentrics for quadriceps activation in NWB ? ?Therapeutic Activities: Lateral step-down off 2 and 4 inch steps 2 sets of 10 each slow eccentrics and step-up and over 20X slow eccentrics 4 inch step ? ?Leg Press single-leg (L and R) 2 sets of 10 each slow eccentrics 37.5# (emphasis on full extension, avoid hyperextension) ? ?Vasopneumatic Medium R knee 10 minutes 34* ? ? ?04/24/2021: ?Therapeutic Exercise: Seated straight leg raises 4 sets of 5 for 3 seconds, slow eccentrics for quadriceps activation in NWB ? ?Therapeutic Activities: Review HEP, knee anatomy and discuss her appointment with Dr. Ninfa Linden ? ? ?04/17/2021: ?TherEx: ? Supine: Bridges x 10 holding 5 seconds ?Seated: SLR x10, SLR x 10 with ER ?Aerobic: recumbent scifit bike x 6 minutes ?Leg Press: 50# bilateral LE's 2 x10, Rt LE only: 25# 2x10 ?Step ups 4 inch step x 15 no UE support ?SLS on Airex Mat x 1 minute with UE support and tap downs with her Left LE.  ?Hip flexor stretch in lunge position x 2 holding 20 seconds each ?Modalities:  ?Vasopneumatic: x 10 minutes, medium compression 34 degrees.  ? ?     ?PATIENT EDUCATION:  ?Education details: Patient ?Person educated: Patient ?Education method: Explanation, Demonstration, Tactile cues, and Verbal cues ?Education comprehension: verbalized understanding and returned demonstration ? ? ?HOME EXERCISE PROGRAM: ?Access Code: [email or web address] ?URL: [email or web address] ?Date: 04/17/2021 ?Prepared by: Nicole Mercer ? ?Exercises ?- Supine Active Straight Leg Raise  - 3 x daily - 7 x weekly - 2 sets - 10 reps ?- Straight Leg Raise with External Rotation  - 3 x daily - 7 x weekly - 2 sets - 10 reps ?- Supine Bridge  - 3 x daily - 7 x weekly - 2 sets - 10 reps ?- Supine Quad Set  - 3 x daily - 7 x weekly - 2 sets - 10 reps ?- Sit to Stand  - 3 x daily - 7 x weekly - 2 sets - 10 reps ?- Standing Hip Flexor Stretch  - 2 x daily - 7 x weekly - 2-3  reps - 30 seconds hold ?- Standing Gastroc Stretch on Foam 1/2 Roll  - 2 x daily - 7 x weekly - 2-3 reps - 20-30 seconds hold ?- Full Leg Press  - 1 x daily - 7 x weekly - 2 sets - 10 reps ?- Recumbent Bike  - 1 x daily ?-  Step Up  - 1 x daily - 7 x weekly - 15 reps ?- Lateral Step Up  - 1 x daily - 7 x weekly - 15 reps ?- Standing Hip Abduction with Counter Support  - 1 x daily - 7 x weekly - 15 reps ? ?Added 30-50/day seated SLR with slow eccentrics to HEP ? ?ASSESSMENT: ? ?CLINICAL IMPRESSION: ?05/04/2021: ?Nicole Mercer reports feeling stronger vs last week.  From discussions with her, she is likely overdoing her activities at home and at the gym.  We dicussed cutting her HEP to 50 seated SLR a day (quadriceps strength, NWB) and the gym 3X/week emphasizing the bike, light leg press with slow eccentrics and stairs within a comfortable range using a handrail as needed to keep the strain on the quadriceps and off the knee.  Her prognosis to continue to improve is good with continued PT. ? ? ?04/24/2021: ?Nicole Mercer reports early frustration with her perceived lack of progress.  We reviewed her imaging, knee anatomy and discussed expected healing times.  We discussed the importance of focusing on strengthening the primary shock absorber of her knee joint (quadriceps) and added an activity to her HEP to complement her existing program.  Her prognosis is good with continued strength gains and time for normal healing. ? ? ?04/17/2021:  ?Pt arriving reporting increased stiffness and increased pain up to 10/10 at times. Pt has been consistent with her HEP and is going to the gym daily and working on the bike. Pt feels doing the leg press at the gym may be too much weight and we discussed holding off on the leg press to see if her pain lessens. Pt's HEP was updated to add step ups. Continue skilled PT to progress toward maximizing function.  ? ? ? ?OBJECTIVE IMPAIRMENTS Abnormal gait, decreased activity tolerance, decreased balance,  decreased mobility, difficulty walking, decreased ROM, increased edema, and pain.  ? ?ACTIVITY LIMITATIONS community activity and occupation.  ? ?PERSONAL FACTORS anxiety, osteoporosis are also affecting patient's func

## 2021-05-04 NOTE — Telephone Encounter (Signed)
Submitted for SynviscOne, right knee. ?BV pending ? ?

## 2021-05-04 NOTE — Telephone Encounter (Signed)
Called and left a VM for patient to CB concerning gel injection.  ?

## 2021-05-08 ENCOUNTER — Encounter: Payer: Federal, State, Local not specified - PPO | Admitting: Physical Therapy

## 2021-05-08 DIAGNOSIS — H43813 Vitreous degeneration, bilateral: Secondary | ICD-10-CM | POA: Diagnosis not present

## 2021-05-11 ENCOUNTER — Encounter: Payer: Self-pay | Admitting: Rehabilitative and Restorative Service Providers"

## 2021-05-11 ENCOUNTER — Ambulatory Visit (INDEPENDENT_AMBULATORY_CARE_PROVIDER_SITE_OTHER): Payer: Medicare Other | Admitting: Rehabilitative and Restorative Service Providers"

## 2021-05-11 DIAGNOSIS — R262 Difficulty in walking, not elsewhere classified: Secondary | ICD-10-CM

## 2021-05-11 DIAGNOSIS — R600 Localized edema: Secondary | ICD-10-CM

## 2021-05-11 DIAGNOSIS — M25561 Pain in right knee: Secondary | ICD-10-CM

## 2021-05-11 NOTE — Therapy (Signed)
?OUTPATIENT PHYSICAL THERAPY LOWER EXTREMITY  ?TREATMENT NOTE ? ? ?Patient Name: Nicole Mercer ?MRN: [id number] ?DOB:1945/08/19, 76 y.o., female ?Today's Date: 05/11/2021 ? ? PT End of Session - 05/11/21 1309   ? ? Visit Number 6   ? Number of Visits 7   ? Date for PT Re-Evaluation 05/12/21   ? PT Start Time 1302   ? PT Stop Time 1350   ? PT Time Calculation (min) 48 min   ? Activity Tolerance Patient tolerated treatment well;No increased pain   ? Behavior During Therapy [facility] for tasks assessed/performed   ? ?  ?  ? ?  ? ? ? ? ? ? ? ?Past Medical History:  ?Diagnosis Date  ? Allergy   ? Anxiety   ? Cataract   ? Diverticulosis   ? Esophageal spasm   ? GERD (gastroesophageal reflux disease)   ? Helicobacter pylori gastritis   ? Osteoporosis   ? Urticaria   ? ?Past Surgical History:  ?Procedure Laterality Date  ? COLONOSCOPY    ? ESOPHAGOGASTRODUODENOSCOPY    ? ROTATOR CUFF REPAIR  2021  ? SIGMOIDOSCOPY    ? UPPER GASTROINTESTINAL ENDOSCOPY    ? ?Patient Active Problem List  ? Diagnosis Date Noted  ? ESOPHAGEAL SPASM 07/29/2007  ? GERD 07/29/2007  ? DIVERTICULOSIS OF COLON 07/29/2007  ? ? ?PCP: Josetta Huddle, MD ? ?REFERRING PROVIDER: Mcarthur Rossetti, MD ? ?REFERRING DIAG: F02.774J (ICD-10-CM) - Acute lateral meniscus tear of right knee, subsequent encounter ? ?THERAPY DIAG:  ?Difficulty in walking, not elsewhere classified ? ?Acute pain of right knee ? ?Localized edema ? ?ONSET DATE: October 2022, March 02/2021 s/p lateral meniscus arthroscopy ? ?SUBJECTIVE:  ? ?SUBJECTIVE STATEMENT: ?Nicole Mercer says she has been able to do stairs much easier the past 3 days "like I used to be able to."  She has backed off the gym and given herself more time to recover. ? ?PERTINENT HISTORY: ?Anxiety, osteoporosis ? ?PAIN:  ?Are you having pain?  2-4/10. ?Location: Rt knee  ?Pain relieving: tylenol, ice ?Pain aggravating: bending, prolonged WB ? ?PRECAUTIONS: None ? ?WEIGHT BEARING RESTRICTIONS No ? ?FALLS:  ?Has patient fallen in  last 6 months? No, Number of falls: 0 ? ?LIVING ENVIRONMENT: ?Lives with: lives alone ?Lives in: House/apartment ?Stairs: Yes; Internal: 15 steps; on left going up ?Has following equipment at home: None ? ?OCCUPATION: retail sales ? ?PLOF: Independent ? ?PATIENT GOALS Stop hurting, back to work without pain ? ? ?OBJECTIVE:  ? ?DIAGNOSTIC FINDINGS: MRI ? ?PATIENT SURVEYS:  ?05/04/2021: FOTO: 54 (was 41, Goal 62); ?03/27/2021: FOTO: 41 % (predicted 62%) ? ?COGNITION: ? Overall cognitive status: Within functional limits for tasks assessed   ?  ?SENSATION: ?Light touch: WFL ? ?MUSCLE LENGTH: ?Hamstrings: Right 80 deg; Left 90 deg ? ? ?POSTURE:  ?Rounded shoulders ? ?PALPATION: ? TTP: around anterior medial joint line.  ?  ? EDEMA:  ? 04/17/2021:  ?                   Rt knee: 45.5 centimeters circumference around mid patella ?    Left knee: 43 centimeters circumference around mid patella ? ?LE ROM: ? ?Active ROM Right ?03/27/2021 Left ?03/27/2021 Rt ?04/11/2021 ?Passive Rt ?04/17/2021 ?passive Right 05/04/2021  ?Hip flexion       ?Hip extension       ?Hip abduction       ?Hip adduction       ?Hip internal rotation       ?  Hip external rotation       ?Knee flexion 110 138 115 116 124  ?Knee extension -5 0 0 0 0  ?Ankle dorsiflexion       ?Ankle plantarflexion       ?Ankle inversion       ?Ankle eversion       ? (Blank rows = not tested) ? ?LE MMT: ? ?MMT Right ?03/27/2021 Left ?03/27/2021  ?Hip flexion 5/5 5/5  ?Hip extension    ?Hip abduction 5/5 5/5  ?Hip adduction 5/5 5/5  ?Hip internal rotation    ?Hip external rotation    ?Knee flexion 3/5 5/5  ?Knee extension 3/5 5/5  ?Ankle dorsiflexion    ?Ankle plantarflexion    ?Ankle inversion    ?Ankle eversion    ? (Blank rows = not tested) ? ? ? ?FUNCTIONAL TESTS:  ?5 times sit to stand: 20 seconds with UE support ? ?GAIT: ?Distance walked: 50 feet ?Assistive device utilized: None ?Level of assistance: Complete Independence ?Comments: step through gait pattern ? ? ? ?TODAY'S  TREATMENT: ?05/11/2021: ?Therapeutic Exercise: Seated straight leg raises 4 sets of 5 for 3 seconds, slow eccentrics for quadriceps activation in NWB ? ?Therapeutic Activities: Lateral step-down off 2, 4 and 6 inch steps with and without hands (with hands only 6 inch step) 10 each slow eccentrics and step-up and over 20X slow eccentrics 4 inch step ? ?Neuromuscular: Tandem balance eyes open 3X 20 seconds ? ?Vasopneumatic Medium R knee 10 minutes 34* ? ? ?05/04/2021: ?Therapeutic Exercise: Seated straight leg raises 4 sets of 5 for 3 seconds, slow eccentrics for quadriceps activation in NWB ? ?Therapeutic Activities: Lateral step-down off 2 and 4 inch steps 2 sets of 10 each slow eccentrics and step-up and over 20X slow eccentrics 4 inch step ? ?Leg Press single-leg (L and R) 2 sets of 10 each slow eccentrics 37.5# (emphasis on full extension, avoid hyperextension) ? ?Vasopneumatic Medium R knee 10 minutes 34* ? ? ?04/24/2021: ?Therapeutic Exercise: Seated straight leg raises 4 sets of 5 for 3 seconds, slow eccentrics for quadriceps activation in NWB ? ?Therapeutic Activities: Review HEP, knee anatomy and discuss her appointment with Dr. Ninfa Linden ? ?     ?PATIENT EDUCATION:  ?Education details: Patient ?Person educated: Patient ?Education method: Explanation, Demonstration, Tactile cues, and Verbal cues ?Education comprehension: verbalized understanding and returned demonstration ? ? ?HOME EXERCISE PROGRAM: ?Access Code: [email or web address] ?URL: [email or web address] ?Date: 04/17/2021 ?Prepared by: Kearney Hard ? ?Exercises ? ?30-50/day seated SLR with slow eccentrics to HEP ? ?ASSESSMENT: ? ?CLINICAL IMPRESSION: ?05/11/2021: Nicole Mercer notes a huge improvement in her ability to do stairs this week vs last.  As expected, decreasing her home and gym activities allowed her to recover faster and seated straight leg raises are strengthening her quadriceps while avoiding WB overuse.  She is preparing to go to [place] for a  week and will return for reassessment. ? ? ?05/04/2021: ?Nicole Mercer reports feeling stronger vs last week.  From discussions with her, she is likely overdoing her activities at home and at the gym.  We dicussed cutting her HEP to 50 seated SLR a day (quadriceps strength, NWB) and the gym 3X/week emphasizing the bike, light leg press with slow eccentrics and stairs within a comfortable range using a handrail as needed to keep the strain on the quadriceps and off the knee.  Her prognosis to continue to improve is good with continued PT. ? ? ?04/24/2021: ?Neva reports early frustration with her perceived lack of progress.  We reviewed her imaging, knee anatomy and discussed expected healing times.  We discussed the importance of focusing on strengthening the primary shock absorber of her knee joint (quadriceps) and added an activity to her HEP to complement her existing program.  Her prognosis is good with continued strength gains and time for normal healing. ? ? ?OBJECTIVE IMPAIRMENTS Abnormal gait, decreased activity tolerance, decreased balance, decreased mobility, difficulty walking, decreased ROM, increased edema, and pain.  ? ?ACTIVITY LIMITATIONS community activity and occupation.  ? ?PERSONAL FACTORS anxiety, osteoporosis are also affecting patient's functional outcome.  ? ? ?REHAB POTENTIAL: Excellent ? ?CLINICAL DECISION MAKING: Stable/uncomplicated ? ?EVALUATION COMPLEXITY: Low ? ? ?GOALS: ?Goals reviewed with patient? Yes ? ?Short term PT Goals (target date for Short term goals are 3 weeks 04/17/2021) ?Patient will demonstrate independent use of home exercise program to maintain progress from in clinic treatments. ?Goal status: MET 04/17/2021 ?  ?Long term PT goals (target dates for all long term goals are 6 weeks to  06/09/2021  ) ?Patient will demonstrate/report pain at worst less than or equal to 2/10 to facilitate minimal limitation in daily activity secondary to pain symptoms. ?Goal status: On-going: 05/11/2021  (2-5/10) ? ?Patient will demonstrate independent use of home exercise program to facilitate ability to maintain/progress functional gains from skilled physical therapy services. ?Goal status: Met 05/04/2021 ? ?Patient wil

## 2021-05-19 ENCOUNTER — Telehealth: Payer: Self-pay | Admitting: Orthopaedic Surgery

## 2021-05-19 NOTE — Telephone Encounter (Signed)
Patient called asked for a call back concerning the gel injection. The number to contact patient is [phone]  ?

## 2021-05-19 NOTE — Telephone Encounter (Signed)
Talked with patient and appointment has been scheduled for Gel-One Injection. ?

## 2021-05-23 ENCOUNTER — Other Ambulatory Visit: Payer: Self-pay

## 2021-05-23 DIAGNOSIS — S83281D Other tear of lateral meniscus, current injury, right knee, subsequent encounter: Secondary | ICD-10-CM

## 2021-05-23 NOTE — Progress Notes (Signed)
Gel-One injection approved ?

## 2021-05-24 ENCOUNTER — Ambulatory Visit (INDEPENDENT_AMBULATORY_CARE_PROVIDER_SITE_OTHER): Payer: Medicare Other | Admitting: Orthopaedic Surgery

## 2021-05-24 ENCOUNTER — Encounter: Payer: Self-pay | Admitting: Rehabilitative and Restorative Service Providers"

## 2021-05-24 ENCOUNTER — Ambulatory Visit (INDEPENDENT_AMBULATORY_CARE_PROVIDER_SITE_OTHER): Payer: Medicare Other | Admitting: Rehabilitative and Restorative Service Providers"

## 2021-05-24 DIAGNOSIS — M1711 Unilateral primary osteoarthritis, right knee: Secondary | ICD-10-CM

## 2021-05-24 DIAGNOSIS — R262 Difficulty in walking, not elsewhere classified: Secondary | ICD-10-CM

## 2021-05-24 DIAGNOSIS — M25561 Pain in right knee: Secondary | ICD-10-CM | POA: Diagnosis not present

## 2021-05-24 DIAGNOSIS — G8929 Other chronic pain: Secondary | ICD-10-CM

## 2021-05-24 DIAGNOSIS — R600 Localized edema: Secondary | ICD-10-CM

## 2021-05-24 MED ORDER — CROSS-LINK HYAL ACID (VISC) 30 MG/3ML IX PRSY
30.0000 mg | PREFILLED_SYRINGE | INTRA_ARTICULAR | Status: AC | PRN
  Administered 2021-05-24: 30 mg via INTRA_ARTICULAR

## 2021-05-24 NOTE — Therapy (Signed)
?OUTPATIENT PHYSICAL THERAPY LOWER EXTREMITY  ?TREATMENT NOTE ? ? ?Patient Name: Nicole Mercer ?MRN: [id number] ?DOB:06/04/1945, 76 y.o., female ?Today's Date: 05/24/2021 ? ?Progress Note ?Reporting Period 03/27/2021 to 05/24/2021 ? ?See note below for Objective Data and Assessment of Progress/Goals.  ? ?  ? PT End of Session - 05/24/21 1307   ? ? Visit Number 7   ? Number of Visits 7   ? Date for PT Re-Evaluation 05/12/21   ? PT Start Time 1300   ? PT Stop Time 1340   ? PT Time Calculation (min) 40 min   ? Activity Tolerance Patient tolerated treatment well;No increased pain   ? Behavior During Therapy [facility] for tasks assessed/performed   ? ?  ?  ? ?  ? ? ? ? ? ? ? ? ?Past Medical History:  ?Diagnosis Date  ? Allergy   ? Anxiety   ? Cataract   ? Diverticulosis   ? Esophageal spasm   ? GERD (gastroesophageal reflux disease)   ? Helicobacter pylori gastritis   ? Osteoporosis   ? Urticaria   ? ?Past Surgical History:  ?Procedure Laterality Date  ? COLONOSCOPY    ? ESOPHAGOGASTRODUODENOSCOPY    ? ROTATOR CUFF REPAIR  2021  ? SIGMOIDOSCOPY    ? UPPER GASTROINTESTINAL ENDOSCOPY    ? ?Patient Active Problem List  ? Diagnosis Date Noted  ? ESOPHAGEAL SPASM 07/29/2007  ? GERD 07/29/2007  ? DIVERTICULOSIS OF COLON 07/29/2007  ? ? ?PCP: Josetta Huddle, MD ? ?REFERRING PROVIDER: Mcarthur Rossetti, MD ? ?REFERRING DIAG: W96.759F (ICD-10-CM) - Acute lateral meniscus tear of right knee, subsequent encounter ? ?THERAPY DIAG:  ?Difficulty in walking, not elsewhere classified ? ?Localized edema ? ?Acute pain of right knee ? ?ONSET DATE: October 2022, March 02/2021 s/p lateral meniscus arthroscopy ? ?SUBJECTIVE:  ? ?SUBJECTIVE STATEMENT: ?Nicole Mercer says she held up well with her travel.  She was compliant with her straight leg raises but "rested" with other activities due to her extra walking. ? ?PERTINENT HISTORY: ?Anxiety, osteoporosis ? ?PAIN:  ?Are you having pain?  0-3/10. ?Location: Rt knee  ?Pain relieving: Tylenol, ice ?Pain  aggravating: Bending, prolonged WB ? ?PRECAUTIONS: None ? ?WEIGHT BEARING RESTRICTIONS No ? ?FALLS:  ?Has patient fallen in last 6 months? No, Number of falls: 0 ? ?LIVING ENVIRONMENT: ?Lives with: lives alone ?Lives in: House/apartment ?Stairs: Yes; Internal: 15 steps; on left going up ?Has following equipment at home: None ? ?OCCUPATION: retail sales ? ?PLOF: Independent ? ?PATIENT GOALS Stop hurting, back to work without pain ? ? ?OBJECTIVE:  ? ?DIAGNOSTIC FINDINGS: MRI ? ?PATIENT SURVEYS:  ?05/24/1021: FOTO: 67 (Goal 62); ?05/04/2021: FOTO: 54 (was 41, Goal 62); ?03/27/2021: FOTO: 41 % (predicted 62%) ? ?COGNITION: ? Overall cognitive status: Within functional limits for tasks assessed   ?  ?SENSATION: ?Light touch: WFL ? ?MUSCLE LENGTH: ?Hamstrings: Right 80 deg; Left 90 deg ? ? ?POSTURE:  ?Rounded shoulders ? ?PALPATION: ? TTP: around anterior medial joint line.  ?  ? EDEMA:  ? 04/17/2021:  ?                   Rt knee: 45.5 centimeters circumference around mid patella ?    Left knee: 43 centimeters circumference around mid patella ? ?LE ROM: ? ?Active ROM Right ?03/27/2021 Left ?03/27/2021 Rt ?04/11/2021 ?Passive Rt ?04/17/2021 ?passive Right 05/04/2021 Right 05/24/2021  ?Hip flexion        ?Hip extension        ?Hip  abduction        ?Hip adduction        ?Hip internal rotation        ?Hip external rotation        ?Knee flexion 110 138 115 116 124 125  ?Knee extension -5 0 0 0 0 0  ?Ankle dorsiflexion        ?Ankle plantarflexion        ?Ankle inversion        ?Ankle eversion        ? (Blank rows = not tested) ? ?LE MMT: ? ?MMT Right ?03/27/2021 Left ?03/27/2021  ?Hip flexion 5/5 5/5  ?Hip extension    ?Hip abduction 5/5 5/5  ?Hip adduction 5/5 5/5  ?Hip internal rotation    ?Hip external rotation    ?Knee flexion 3/5 5/5  ?Knee extension 3/5 5/5  ?Ankle dorsiflexion    ?Ankle plantarflexion    ?Ankle inversion    ?Ankle eversion    ? (Blank rows = not tested) ? ? ? ?FUNCTIONAL TESTS:  ?5 times sit to stand: 20 seconds with  UE support ? ?GAIT: ?Distance walked: 50 feet ?Assistive device utilized: None ?Level of assistance: Complete Independence ?Comments: step through gait pattern ? ? ? ?TODAY'S TREATMENT: ?05/24/2021: ?Therapeutic Exercise: Seated straight leg raises 4 sets of 5 for 3 seconds with 1#, slow eccentrics for quadriceps activation in NWB.  Focus on pause after tightening the thigh, raise 6-8 inches and slowly lower ? ?Therapeutic Activities: Lateral step-down off 4 and 6 inch steps with and without hands (with hands only 6 inch step) 10 each slow eccentrics and step-up and over 20X slow eccentrics 6 inch step ? ?Leg Press for sit to stand and stairs single leg (B) 10X with 37.5 pounds slow eccentrics ? ?Neuromuscular: Tandem balance eyes open 3X 20 seconds ? ? ?05/11/2021: ?Therapeutic Exercise: Seated straight leg raises 4 sets of 5 for 3 seconds, slow eccentrics for quadriceps activation in NWB ? ?Therapeutic Activities: Lateral step-down off 2, 4 and 6 inch steps with and without hands (with hands only 6 inch step) 10 each slow eccentrics and step-up and over 20X slow eccentrics 4 inch step ? ?Neuromuscular: Tandem balance eyes open 3X 20 seconds ? ?Vasopneumatic Medium R knee 10 minutes 34* ? ? ?05/04/2021: ?Therapeutic Exercise: Seated straight leg raises 4 sets of 5 for 3 seconds, slow eccentrics for quadriceps activation in NWB ? ?Therapeutic Activities: Lateral step-down off 2 and 4 inch steps 2 sets of 10 each slow eccentrics and step-up and over 20X slow eccentrics 4 inch step ? ?Leg Press single-leg (L and R) 2 sets of 10 each slow eccentrics 37.5# (emphasis on full extension, avoid hyperextension) ? ?Neuromuscular: Tandem balance 3X 20 seconds ? ?     ?PATIENT EDUCATION:  ?Education details: Patient ?Person educated: Patient ?Education method: Explanation, Demonstration, Tactile cues, and Verbal cues ?Education comprehension: verbalized understanding and returned demonstration ? ? ?HOME EXERCISE PROGRAM: ?Access  Code: [email or web address] ?URL: [email or web address] ?Date: 04/17/2021 ?Prepared by: Kearney Hard ? ?Exercises ? ?30-50/day seated SLR with slow eccentrics to HEP ? ?ASSESSMENT: ? ?CLINICAL IMPRESSION: ?05/24/2021: Wafaa has made excellent progress with her physical therapy.  Self-reported pain and function are much better.  She appears ready to transfer into independent rehabilitation but she request 1 additional follow-up visit before her big trip to [place] in 2 weeks.  She should be ready for DC after this 1 follow-up visit.  Excellent outcome in 7 visits as FOTO predicted 15  visits. ? ? ?05/11/2021: Maudry notes a huge improvement in her ability to do stairs this week vs last.  As expected, decreasing her home and gym activities allowed her to recover faster and seated straight leg raises are strengthening her quadriceps while avoiding WB overuse.  She is preparing to go to [place] for a week and will return for reassessment. ? ? ?05/04/2021: ?Kathelyn reports feeling stronger vs last week.  From discussions with her, she is likely overdoing her activities at home and at the gym.  We dicussed cutting her HEP to 50 seated SLR a day (quadriceps strength, NWB) and the gym 3X/week emphasizing the bike, light leg press with slow eccentrics and stairs within a comfortable range using a handrail as needed to keep the strain on the quadriceps and off the knee.  Her prognosis to continue to improve is good with continued PT. ? ?OBJECTIVE IMPAIRMENTS Abnormal gait, decreased activity tolerance, decreased balance, decreased mobility, difficulty walking, decreased ROM, increased edema, and pain.  ? ?ACTIVITY LIMITATIONS community activity and occupation.  ? ?PERSONAL FACTORS anxiety, osteoporosis are also affecting patient's functional outcome.  ? ? ?REHAB POTENTIAL: Excellent ? ?CLINICAL DECISION MAKING: Stable/uncomplicated ? ?EVALUATION COMPLEXITY: Low ? ? ?GOALS: ?Goals reviewed with patient? Yes ? ?Short term PT Goals  (target date for Short term goals are 3 weeks 04/17/2021) ?Patient will demonstrate independent use of home exercise program to maintain progress from in clinic treatments. ?Goal status: MET 04/17/2021 ?  ?Long te

## 2021-05-24 NOTE — Progress Notes (Signed)
? ?  Procedure Note ? ?Patient: Nicole Mercer             ?Date of Birth: November 12, 1945           ?MRN: [id number]             ?Visit Date: 05/24/2021 ? ?Procedures: ?Visit Diagnoses:  ?1. Chronic pain of right knee   ?2. Unilateral primary osteoarthritis, right knee   ? ? ?Large Joint Inj: R knee on 05/24/2021 2:35 PM ?Indications: diagnostic evaluation and pain ?Details: 22 G 1.5 in needle, superolateral approach ? ?Arthrogram: No ? ?Medications: 30 mg Cross-Linked Hyaluronate 30 MG/3ML ?Outcome: tolerated well, no immediate complications ?Procedure, treatment alternatives, risks and benefits explained, specific risks discussed. Consent was given by the patient. Immediately prior to procedure a time out was called to verify the correct patient, procedure, equipment, support staff and site/side marked as required. Patient was prepped and draped in the usual sterile fashion.  ? ?The patient is a very active and young appearing 76 year old female who comes in today for a hyaluronic acid injection in her right knee to treat the pain from osteoarthritis.  She is thin.  She does have a history of a right knee arthroscopy.  She still has enough remaining cartilage in her knee to benefit from hyaluronic acid.  She has failed other conservative treatment measures. ? ?I did place an injection of gel 1 in her right knee today without difficulty.  All question concerns were answered and addressed.  Follow-up at this point is as needed. ? ? ? ?

## 2021-05-24 NOTE — Addendum Note (Signed)
Addended by: Maricela Bo on: 05/24/2021 04:59 PM ? ? Modules accepted: Orders ? ?

## 2021-05-30 ENCOUNTER — Ambulatory Visit (INDEPENDENT_AMBULATORY_CARE_PROVIDER_SITE_OTHER): Payer: Medicare Other | Admitting: Rehabilitative and Restorative Service Providers"

## 2021-05-30 ENCOUNTER — Encounter: Payer: Self-pay | Admitting: Rehabilitative and Restorative Service Providers"

## 2021-05-30 DIAGNOSIS — M25562 Pain in left knee: Secondary | ICD-10-CM | POA: Diagnosis not present

## 2021-05-30 DIAGNOSIS — R262 Difficulty in walking, not elsewhere classified: Secondary | ICD-10-CM

## 2021-05-30 DIAGNOSIS — R600 Localized edema: Secondary | ICD-10-CM | POA: Diagnosis not present

## 2021-05-30 DIAGNOSIS — M25561 Pain in right knee: Secondary | ICD-10-CM | POA: Diagnosis not present

## 2021-05-30 NOTE — Therapy (Signed)
?OUTPATIENT PHYSICAL THERAPY LOWER EXTREMITY  ?TREATMENT NOTE ? ? ?Patient Name: Nicole Mercer ?MRN: [id number] ?DOB:1945-08-07, 76 y.o., female ?Today's Date: 05/30/2021 ? ?Progress Note ?Reporting Period 03/27/2021 to 05/24/2021 ? ?See note below for Objective Data and Assessment of Progress/Goals.  ? ?  ? PT End of Session - 05/30/21 1156   ? ? Visit Number 8   ? Number of Visits 7   ? Date for PT Re-Evaluation 05/12/21   ? PT Start Time 1145   ? PT Stop Time 1225   ? PT Time Calculation (min) 40 min   ? Activity Tolerance Patient tolerated treatment well;No increased pain   ? Behavior During Therapy [facility] for tasks assessed/performed   ? ?  ?  ? ?  ? ? ? ? ? ? ? ? ? ?Past Medical History:  ?Diagnosis Date  ? Allergy   ? Anxiety   ? Cataract   ? Diverticulosis   ? Esophageal spasm   ? GERD (gastroesophageal reflux disease)   ? Helicobacter pylori gastritis   ? Osteoporosis   ? Urticaria   ? ?Past Surgical History:  ?Procedure Laterality Date  ? COLONOSCOPY    ? ESOPHAGOGASTRODUODENOSCOPY    ? ROTATOR CUFF REPAIR  2021  ? SIGMOIDOSCOPY    ? UPPER GASTROINTESTINAL ENDOSCOPY    ? ?Patient Active Problem List  ? Diagnosis Date Noted  ? ESOPHAGEAL SPASM 07/29/2007  ? GERD 07/29/2007  ? DIVERTICULOSIS OF COLON 07/29/2007  ? ? ?PCP: Josetta Huddle, MD ? ?REFERRING PROVIDER: Mcarthur Rossetti, MD ? ?REFERRING DIAG: U13.143O (ICD-10-CM) - Acute lateral meniscus tear of right knee, subsequent encounter ? ?THERAPY DIAG:  ?Difficulty in walking, not elsewhere classified ? ?Localized edema ? ?Acute pain of left knee ? ?Acute pain of right knee ? ?ONSET DATE: October 2022, March 02/2021 s/p lateral meniscus arthroscopy ? ?SUBJECTIVE:  ? ?SUBJECTIVE STATEMENT: ?Lashauna says she is very happy with her progress with her R knee. ? ?PERTINENT HISTORY: ?Anxiety, osteoporosis ? ?PAIN:  ?Are you having pain?  0-4/10. ?Location: Rt knee  ?Pain relieving: Tylenol, ice ?Pain aggravating: Bending, prolonged WB ? ?PRECAUTIONS: None ? ?WEIGHT  BEARING RESTRICTIONS No ? ?FALLS:  ?Has patient fallen in last 6 months? No, Number of falls: 0 ? ?LIVING ENVIRONMENT: ?Lives with: lives alone ?Lives in: House/apartment ?Stairs: Yes; Internal: 15 steps; on left going up ?Has following equipment at home: None ? ?OCCUPATION: retail sales ? ?PLOF: Independent ? ?PATIENT GOALS Stop hurting, back to work without pain ? ? ?OBJECTIVE:  ? ?DIAGNOSTIC FINDINGS: MRI ? ?PATIENT SURVEYS:  ?05/24/1021: FOTO: 67 (Goal 62); ?05/04/2021: FOTO: 54 (was 41, Goal 62); ?03/27/2021: FOTO: 41 % (predicted 62%) ? ?COGNITION: ? Overall cognitive status: Within functional limits for tasks assessed   ?  ?SENSATION: ?Light touch: WFL ? ?MUSCLE LENGTH: ?Hamstrings: Right 80 deg; Left 90 deg ? ? ?POSTURE:  ?Rounded shoulders ? ?PALPATION: ? TTP: around anterior medial joint line.  ?  ? EDEMA:  ? 04/17/2021:  ?                   Rt knee: 45.5 centimeters circumference around mid patella ?    Left knee: 43 centimeters circumference around mid patella ? ?LE ROM: ? ?Active ROM Right ?03/27/2021 Left ?03/27/2021 Rt ?04/11/2021 ?Passive Rt ?04/17/2021 ?passive Right 05/04/2021 Right 05/24/2021  ?Hip flexion        ?Hip extension        ?Hip abduction        ?  Hip adduction        ?Hip internal rotation        ?Hip external rotation        ?Knee flexion 110 138 115 116 124 125  ?Knee extension -5 0 0 0 0 0  ?Ankle dorsiflexion        ?Ankle plantarflexion        ?Ankle inversion        ?Ankle eversion        ? (Blank rows = not tested) ? ?LE MMT: ? ?MMT Right ?03/27/2021 Left ?03/27/2021  ?Hip flexion 5/5 5/5  ?Hip extension    ?Hip abduction 5/5 5/5  ?Hip adduction 5/5 5/5  ?Hip internal rotation    ?Hip external rotation    ?Knee flexion 3/5 5/5  ?Knee extension 3/5 5/5  ?Ankle dorsiflexion    ?Ankle plantarflexion    ?Ankle inversion    ?Ankle eversion    ? (Blank rows = not tested) ? ? ? ?FUNCTIONAL TESTS:  ?5 times sit to stand: 20 seconds with UE support ? ?GAIT: ?Distance walked: 50 feet ?Assistive device  utilized: None ?Level of assistance: Complete Independence ?Comments: step through gait pattern ? ? ? ?TODAY'S TREATMENT: ?05/30/2021: ?Therapeutic Exercise: Seated straight leg raises 4 sets of 5 for 3 seconds with 1#, slow eccentrics for quadriceps activation in NWB.  Focus on pause after tightening the thigh, raise 6-8 inches and slowly lower ? ?Therapeutic Activities: Lateral step-down off 4 and 6 inch steps with and without hands (with hands only 6 inch step) 10 each slow eccentrics ? ?Neuromuscular: Tandem balance eyes open 3X 20 seconds ? ? ?05/24/2021: ?Therapeutic Exercise: Seated straight leg raises 4 sets of 5 for 3 seconds with 1#, slow eccentrics for quadriceps activation in NWB.  Focus on pause after tightening the thigh, raise 6-8 inches and slowly lower ? ?Therapeutic Activities: Lateral step-down off 4 and 6 inch steps with and without hands (with hands only 6 inch step) 10 each slow eccentrics and step-up and over 20X slow eccentrics 6 inch step ? ?Leg Press for sit to stand and stairs single leg (B) 10X with 37.5 pounds slow eccentrics ? ?Neuromuscular: Tandem balance eyes open 3X 20 seconds ? ? ?05/11/2021: ?Therapeutic Exercise: Seated straight leg raises 4 sets of 5 for 3 seconds, slow eccentrics for quadriceps activation in NWB ? ?Therapeutic Activities: Lateral step-down off 2, 4 and 6 inch steps with and without hands (with hands only 6 inch step) 10 each slow eccentrics and step-up and over 20X slow eccentrics 4 inch step ? ?Neuromuscular: Tandem balance eyes open 3X 20 seconds ? ?Vasopneumatic Medium R knee 10 minutes 34* ? ? ?     ?PATIENT EDUCATION:  ?Education details: Patient ?Person educated: Patient ?Education method: Explanation, Demonstration, Tactile cues, and Verbal cues ?Education comprehension: verbalized understanding and returned demonstration ? ? ?HOME EXERCISE PROGRAM: ?Access Code: [email or web address] ?URL: [email or web address] ?Date: 04/17/2021 ?Prepared by: Kearney Hard ? ?Exercises ? ?30-50/day seated SLR with slow eccentrics to HEP ? ?ASSESSMENT: ? ?CLINICAL IMPRESSION: ?05/30/2021: Shontavia is very happy with her R knee pain and function after completing her supervised physical therapy.  She still requires an Aleve after a 4 hour work shift that requires standing constantly.  This is consistent with her imaging.  We reviewed her HEP and she is ready for DC into independent rehabilitation. ? ? ?05/24/2021: Delmar has made excellent progress with her physical therapy.  Self-reported pain and function are much better.  She appears  ready to transfer into independent rehabilitation but she request 1 additional follow-up visit before her big trip to [place] in 2 weeks.  She should be ready for DC after this 1 follow-up visit.  Excellent outcome in 7 visits as FOTO predicted 15 visits. ? ? ?05/11/2021: Camari notes a huge improvement in her ability to do stairs this week vs last.  As expected, decreasing her home and gym activities allowed her to recover faster and seated straight leg raises are strengthening her quadriceps while avoiding WB overuse.  She is preparing to go to [place] for a week and will return for reassessment. ? ? ?OBJECTIVE IMPAIRMENTS Abnormal gait, decreased activity tolerance, decreased balance, decreased mobility, difficulty walking, decreased ROM, increased edema, and pain.  ? ?ACTIVITY LIMITATIONS community activity and occupation.  ? ?PERSONAL FACTORS anxiety, osteoporosis are also affecting patient's functional outcome.  ? ? ?REHAB POTENTIAL: Excellent ? ?CLINICAL DECISION MAKING: Stable/uncomplicated ? ?EVALUATION COMPLEXITY: Low ? ? ?GOALS: ?Goals reviewed with patient? Yes ? ?Short term PT Goals (target date for Short term goals are 3 weeks 04/17/2021) ?Patient will demonstrate independent use of home exercise program to maintain progress from in clinic treatments. ?Goal status: MET 04/17/2021 ?  ?Long term PT goals (target dates for all long term goals are 6 weeks  to  06/09/2021  ) ?Patient will demonstrate/report pain at worst less than or equal to 2/10 to facilitate minimal limitation in daily activity secondary to pain symptoms. ?Goal status: On-going: 05/30/2021

## 2021-06-07 ENCOUNTER — Encounter: Payer: Federal, State, Local not specified - PPO | Admitting: Rehabilitative and Restorative Service Providers"

## 2021-06-26 ENCOUNTER — Telehealth: Payer: Self-pay

## 2021-06-26 NOTE — Telephone Encounter (Signed)
Patient would like to be worked into the schedule this week.  Stated that both of her legs are swollen and have been for about 10 days and has a knot behind her right knee.  Stated that she was out of the country.  Cb# [phone].  Please advise.  Thank you.

## 2021-07-05 ENCOUNTER — Ambulatory Visit (INDEPENDENT_AMBULATORY_CARE_PROVIDER_SITE_OTHER): Payer: Medicare Other | Admitting: Orthopaedic Surgery

## 2021-07-05 ENCOUNTER — Encounter: Payer: Self-pay | Admitting: Orthopaedic Surgery

## 2021-07-05 DIAGNOSIS — G8929 Other chronic pain: Secondary | ICD-10-CM | POA: Diagnosis not present

## 2021-07-05 DIAGNOSIS — M25561 Pain in right knee: Secondary | ICD-10-CM | POA: Diagnosis not present

## 2021-07-05 MED ORDER — METHYLPREDNISOLONE ACETATE 40 MG/ML IJ SUSP
40.0000 mg | INTRAMUSCULAR | Status: AC | PRN
  Administered 2021-07-05: 40 mg via INTRA_ARTICULAR

## 2021-07-05 MED ORDER — LIDOCAINE HCL 1 % IJ SOLN
3.0000 mL | INTRAMUSCULAR | Status: AC | PRN
  Administered 2021-07-05: 3 mL

## 2021-07-05 NOTE — Progress Notes (Signed)
Office Visit Note   Patient: Nicole Mercer           Date of Birth: March 07, 1945           MRN: [id number] Visit Date: 07/05/2021              Requested by: Josetta Huddle, MD [address] [place],  [place] [address] PCP: Josetta Huddle, MD   Assessment & Plan: Visit Diagnoses:  1. Chronic pain of right knee     Plan: I was able to easily aspirate 25 cc of clear fluid from the right knee consistent with osteoarthritis fluid.  I placed a steroid injection in the knee as well which she tolerated nicely.  She will continue increase her activities.  I have recommended compressive garments for when she travels.  If her knee worsens anyway she knows to let us know and she also knows to wait at least 3 to 4 months between steroid injections.  Since the hyaluronic acid did not help, I would not recommend this again.  Follow-Up Instructions: Return if symptoms worsen or fail to improve.   Orders:  Orders Placed This Encounter  Procedures   Large Joint Inj   No orders of the defined types were placed in this encounter.     Procedures: Large Joint Inj: R knee on 07/05/2021 9:29 AM Indications: diagnostic evaluation and pain Details: 22 G 1.5 in needle, superolateral approach  Arthrogram: No  Medications: 3 mL lidocaine 1 %; 40 mg methylPREDNISolone acetate 40 MG/ML Outcome: tolerated well, no immediate complications Procedure, treatment alternatives, risks and benefits explained, specific risks discussed. Consent was given by the patient. Immediately prior to procedure a time out was called to verify the correct patient, procedure, equipment, support staff and site/side marked as required. Patient was prepped and draped in the usual sterile fashion.       Clinical Data: No additional findings.   Subjective: Chief Complaint  Patient presents with   Right Knee - Follow-up  The patient is a 76 year old female who comes in with right knee pain and swelling.  We have  seen her before and performed arthroscopic intervention of the right knee.  She does have small areas of full-thickness cartilage loss in that knee joint.  She has travel to [place] and is having worsening pain since then and swelling.  She was concerned about fullness in the back of the right knee.  She has had arthroscopic intervention of that knee as well.  The hyaluronic acid was placed in the knee in early May.  She said swelling in her legs and feet and ankle have gone down significantly and gone away since she got back from her trip.  HPI  Review of Systems She currently denies any fever, chills, nausea, vomiting  Objective: Vital Signs: There were no vitals taken for this visit.  Physical Exam She is alert and orient x3 and in no acute distress Ortho Exam Examination of her lower extremities shows no pitting edema today.  Her calves are soft bilaterally and there is negative Homans' sign.  I do feel in the back of her right knee a palpable Baker's cyst but this is more consistent with a cyst on my exam.  She does have a moderate effusion of the right knee but it does move well. Specialty Comments:  No specialty comments available.  Imaging: No results found.   PMFS History: Patient Active Problem List   Diagnosis Date Noted   ESOPHAGEAL  SPASM 07/29/2007   GERD 07/29/2007   DIVERTICULOSIS OF COLON 07/29/2007   Past Medical History:  Diagnosis Date   Allergy    Anxiety    Cataract    Diverticulosis    Esophageal spasm    GERD (gastroesophageal reflux disease)    Helicobacter pylori gastritis    Osteoporosis    Urticaria     Family History  Problem Relation Age of Onset   Colon polyps Mother    Diabetes Mother    Prostate cancer Father    Diabetes Sister    Brain cancer Sister    Colon cancer Neg Hx    Esophageal cancer Neg Hx    Rectal cancer Neg Hx    Stomach cancer Neg Hx     Past Surgical History:  Procedure Laterality Date   COLONOSCOPY      ESOPHAGOGASTRODUODENOSCOPY     ROTATOR CUFF REPAIR  2021   SIGMOIDOSCOPY     UPPER GASTROINTESTINAL ENDOSCOPY     Social History   Occupational History   Occupation: Retired    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

## 2021-09-20 ENCOUNTER — Other Ambulatory Visit: Payer: Self-pay | Admitting: Internal Medicine

## 2021-10-30 ENCOUNTER — Other Ambulatory Visit (HOSPITAL_COMMUNITY): Payer: Self-pay

## 2021-11-03 ENCOUNTER — Other Ambulatory Visit (HOSPITAL_COMMUNITY): Payer: Self-pay

## 2021-11-06 ENCOUNTER — Other Ambulatory Visit (HOSPITAL_COMMUNITY): Payer: Self-pay

## 2021-11-10 DIAGNOSIS — Z23 Encounter for immunization: Secondary | ICD-10-CM | POA: Diagnosis not present

## 2021-11-15 ENCOUNTER — Other Ambulatory Visit: Payer: Self-pay | Admitting: Internal Medicine

## 2021-11-15 DIAGNOSIS — Z13820 Encounter for screening for osteoporosis: Secondary | ICD-10-CM

## 2021-11-15 DIAGNOSIS — G4700 Insomnia, unspecified: Secondary | ICD-10-CM | POA: Diagnosis not present

## 2021-11-15 DIAGNOSIS — E538 Deficiency of other specified B group vitamins: Secondary | ICD-10-CM | POA: Diagnosis not present

## 2021-11-15 DIAGNOSIS — F322 Major depressive disorder, single episode, severe without psychotic features: Secondary | ICD-10-CM | POA: Diagnosis not present

## 2021-11-15 DIAGNOSIS — F329 Major depressive disorder, single episode, unspecified: Secondary | ICD-10-CM | POA: Diagnosis not present

## 2021-11-15 DIAGNOSIS — R319 Hematuria, unspecified: Secondary | ICD-10-CM | POA: Diagnosis not present

## 2021-11-15 DIAGNOSIS — G8929 Other chronic pain: Secondary | ICD-10-CM | POA: Diagnosis not present

## 2021-11-15 DIAGNOSIS — K219 Gastro-esophageal reflux disease without esophagitis: Secondary | ICD-10-CM | POA: Diagnosis not present

## 2021-11-15 DIAGNOSIS — E559 Vitamin D deficiency, unspecified: Secondary | ICD-10-CM | POA: Diagnosis not present

## 2021-11-15 DIAGNOSIS — J449 Chronic obstructive pulmonary disease, unspecified: Secondary | ICD-10-CM | POA: Diagnosis not present

## 2021-11-15 DIAGNOSIS — Z122 Encounter for screening for malignant neoplasm of respiratory organs: Secondary | ICD-10-CM | POA: Diagnosis not present

## 2021-11-15 DIAGNOSIS — F509 Eating disorder, unspecified: Secondary | ICD-10-CM | POA: Diagnosis not present

## 2021-11-15 DIAGNOSIS — Z5181 Encounter for therapeutic drug level monitoring: Secondary | ICD-10-CM | POA: Diagnosis not present

## 2021-11-15 DIAGNOSIS — Z0000 Encounter for general adult medical examination without abnormal findings: Secondary | ICD-10-CM | POA: Diagnosis not present

## 2021-11-17 DIAGNOSIS — M25561 Pain in right knee: Secondary | ICD-10-CM | POA: Diagnosis not present

## 2021-11-24 ENCOUNTER — Other Ambulatory Visit (HOSPITAL_COMMUNITY): Payer: Self-pay

## 2021-11-24 ENCOUNTER — Telehealth: Payer: Self-pay

## 2021-11-24 NOTE — Telephone Encounter (Signed)
Pharmacy Patient Advocate Encounter  Prior Authorization for Pantoprazole '40mg'$  has been approved.    PA# Waiting on Onbase Doc Effective dates:  through    Spoke with Pharmacy to process.  Pharmacy Patient Advocate Encounter   Received notification from [facility] that prior authorization for Pantoprazole '40mg'$  is due for renewal.   PA submitted on 11/24/2021   Joneen Boers, [place] Patient Advocate Specialist [facility] Patient Advocate Team Direct Number: [phone] Fax: [phone]

## 2021-11-27 NOTE — Telephone Encounter (Signed)
Patient called to follow up on her refill request.

## 2021-11-27 NOTE — Telephone Encounter (Signed)
Left message for patient that her PA for pantoprazole has been approved until 11/24/22 and to call our office with any questions.

## 2021-12-04 DIAGNOSIS — M25561 Pain in right knee: Secondary | ICD-10-CM | POA: Diagnosis not present

## 2021-12-22 DIAGNOSIS — M1711 Unilateral primary osteoarthritis, right knee: Secondary | ICD-10-CM | POA: Diagnosis not present

## 2021-12-25 ENCOUNTER — Ambulatory Visit
Admission: RE | Admit: 2021-12-25 | Discharge: 2021-12-25 | Disposition: A | Discharge status: Home / Self Care | Payer: Federal, State, Local not specified - PPO | Source: Ambulatory Visit | Attending: Internal Medicine | Admitting: Internal Medicine

## 2021-12-25 DIAGNOSIS — Z87891 Personal history of nicotine dependence: Secondary | ICD-10-CM

## 2021-12-25 DIAGNOSIS — J432 Centrilobular emphysema: Secondary | ICD-10-CM | POA: Diagnosis not present

## 2021-12-25 DIAGNOSIS — K769 Liver disease, unspecified: Secondary | ICD-10-CM | POA: Diagnosis not present

## 2021-12-25 DIAGNOSIS — J929 Pleural plaque without asbestos: Secondary | ICD-10-CM | POA: Diagnosis not present

## 2021-12-26 ENCOUNTER — Telehealth: Payer: Self-pay | Admitting: Acute Care

## 2021-12-26 NOTE — Telephone Encounter (Signed)
Call report for patient, CT lung scan.  Please call to discuss at [phone]

## 2021-12-26 NOTE — Telephone Encounter (Signed)
Call report from [facility] and [facility] Radiology on Lung Cancer CT  IMPRESSION: 1. New tiny endobronchial nodule in a right upper lobe bronchus technically categorized as Lung-RADS 4A, suspicious. This may simply represent an area of mucoid impaction, however, close attention on follow up low-dose chest CT without contrast in 3 months (please use the following order, "CT CHEST LCS NODULE FOLLOW-UP W/O CM") is recommended. 2. Aortic atherosclerosis. 3. Mild diffuse bronchial wall thickening with very mild centrilobular and paraseptal emphysema; imaging findings suggestive of underlying COPD.

## 2021-12-29 ENCOUNTER — Telehealth: Payer: Self-pay | Admitting: Acute Care

## 2021-12-29 NOTE — Telephone Encounter (Signed)
Message received from front desk staff that patient has returned call for results.

## 2021-12-29 NOTE — Telephone Encounter (Signed)
I have attempted to call the patient with the results of their  Low Dose CT Chest Lung cancer screening scan. There was no answer. I have left a HIPPA compliant VM requesting the patient call the office for the scan results. I included the office contact information in the message. We will await his return call. If no return call we will continue to call until patient is contacted.   This will be a 3 month follow up LDCT. I will discuss with the patient when I can get in touch with her. Thanks so much

## 2022-01-01 NOTE — Telephone Encounter (Signed)
Patient called back to go over results- transferred to [facility] for patient to leave voicemail.

## 2022-01-02 ENCOUNTER — Telehealth: Payer: Self-pay | Admitting: Acute Care

## 2022-01-02 DIAGNOSIS — Z87891 Personal history of nicotine dependence: Secondary | ICD-10-CM

## 2022-01-02 DIAGNOSIS — R911 Solitary pulmonary nodule: Secondary | ICD-10-CM

## 2022-01-02 NOTE — Telephone Encounter (Signed)
Results/plan faxed to PCP and new order placed for 03/2022 LCS nodule follow up LDCT

## 2022-01-02 NOTE — Telephone Encounter (Signed)
I have called the patient with the results of her low dose sxcreening CT Chest. Her scan was read as a Lung RADS 4 A : suspicious findings, either short term follow up in 3 months or alternatively  PET Scan evaluation may be considered when there is a solid component of  8 mm or larger.  There was notation of a New tiny endobronchial nodule in a right upper lobe bronchus technically categorized as Lung-RADS 4A, suspicious. This may simply represent an area of mucoid impaction, however, close attention on follow up low-dose chest CT without contrast in 3 months. Pt. States she was sick 2 weeks prior to having her scan, so this may be the reason for this finding. She is in agreement with a 3 month follow up scan in early March 2024.  Langley Gauss, please fax results to PCP and order 3 month follow up due 03/2022. Thanks so much

## 2022-01-04 DIAGNOSIS — M25561 Pain in right knee: Secondary | ICD-10-CM | POA: Diagnosis not present

## 2022-01-04 DIAGNOSIS — M1711 Unilateral primary osteoarthritis, right knee: Secondary | ICD-10-CM | POA: Diagnosis not present

## 2022-01-16 DIAGNOSIS — M1711 Unilateral primary osteoarthritis, right knee: Secondary | ICD-10-CM | POA: Diagnosis not present

## 2022-01-18 DIAGNOSIS — M1711 Unilateral primary osteoarthritis, right knee: Secondary | ICD-10-CM | POA: Diagnosis not present

## 2022-01-25 DIAGNOSIS — M1711 Unilateral primary osteoarthritis, right knee: Secondary | ICD-10-CM | POA: Diagnosis not present

## 2022-01-30 DIAGNOSIS — M1711 Unilateral primary osteoarthritis, right knee: Secondary | ICD-10-CM | POA: Diagnosis not present

## 2022-02-01 DIAGNOSIS — M1711 Unilateral primary osteoarthritis, right knee: Secondary | ICD-10-CM | POA: Diagnosis not present

## 2022-02-02 DIAGNOSIS — R300 Dysuria: Secondary | ICD-10-CM | POA: Diagnosis not present

## 2022-02-02 DIAGNOSIS — M1712 Unilateral primary osteoarthritis, left knee: Secondary | ICD-10-CM | POA: Diagnosis not present

## 2022-02-02 DIAGNOSIS — R310 Gross hematuria: Secondary | ICD-10-CM | POA: Diagnosis not present

## 2022-02-08 DIAGNOSIS — Z6823 Body mass index (BMI) 23.0-23.9, adult: Secondary | ICD-10-CM | POA: Diagnosis not present

## 2022-02-08 DIAGNOSIS — Z79890 Hormone replacement therapy (postmenopausal): Secondary | ICD-10-CM | POA: Diagnosis not present

## 2022-02-08 DIAGNOSIS — N951 Menopausal and female climacteric states: Secondary | ICD-10-CM | POA: Diagnosis not present

## 2022-02-08 DIAGNOSIS — M1711 Unilateral primary osteoarthritis, right knee: Secondary | ICD-10-CM | POA: Diagnosis not present

## 2022-02-08 DIAGNOSIS — R319 Hematuria, unspecified: Secondary | ICD-10-CM | POA: Diagnosis not present

## 2022-02-08 DIAGNOSIS — Z1231 Encounter for screening mammogram for malignant neoplasm of breast: Secondary | ICD-10-CM | POA: Diagnosis not present

## 2022-02-08 DIAGNOSIS — R300 Dysuria: Secondary | ICD-10-CM | POA: Diagnosis not present

## 2022-02-08 DIAGNOSIS — Z124 Encounter for screening for malignant neoplasm of cervix: Secondary | ICD-10-CM | POA: Diagnosis not present

## 2022-02-13 DIAGNOSIS — M1711 Unilateral primary osteoarthritis, right knee: Secondary | ICD-10-CM | POA: Diagnosis not present

## 2022-02-15 DIAGNOSIS — M1711 Unilateral primary osteoarthritis, right knee: Secondary | ICD-10-CM | POA: Diagnosis not present

## 2022-02-19 DIAGNOSIS — R319 Hematuria, unspecified: Secondary | ICD-10-CM | POA: Diagnosis not present

## 2022-02-19 DIAGNOSIS — R310 Gross hematuria: Secondary | ICD-10-CM | POA: Diagnosis not present

## 2022-02-20 DIAGNOSIS — R310 Gross hematuria: Secondary | ICD-10-CM | POA: Diagnosis not present

## 2022-02-20 DIAGNOSIS — M1711 Unilateral primary osteoarthritis, right knee: Secondary | ICD-10-CM | POA: Diagnosis not present

## 2022-02-22 DIAGNOSIS — M1711 Unilateral primary osteoarthritis, right knee: Secondary | ICD-10-CM | POA: Diagnosis not present

## 2022-02-27 DIAGNOSIS — M1711 Unilateral primary osteoarthritis, right knee: Secondary | ICD-10-CM | POA: Diagnosis not present

## 2022-03-01 DIAGNOSIS — M1711 Unilateral primary osteoarthritis, right knee: Secondary | ICD-10-CM | POA: Diagnosis not present

## 2022-03-06 DIAGNOSIS — M1711 Unilateral primary osteoarthritis, right knee: Secondary | ICD-10-CM | POA: Diagnosis not present

## 2022-03-08 DIAGNOSIS — M1711 Unilateral primary osteoarthritis, right knee: Secondary | ICD-10-CM | POA: Diagnosis not present

## 2022-03-13 DIAGNOSIS — M1711 Unilateral primary osteoarthritis, right knee: Secondary | ICD-10-CM | POA: Diagnosis not present

## 2022-03-15 DIAGNOSIS — M1711 Unilateral primary osteoarthritis, right knee: Secondary | ICD-10-CM | POA: Diagnosis not present

## 2022-03-23 DIAGNOSIS — R310 Gross hematuria: Secondary | ICD-10-CM | POA: Diagnosis not present

## 2022-03-23 DIAGNOSIS — N200 Calculus of kidney: Secondary | ICD-10-CM | POA: Diagnosis not present

## 2022-03-26 ENCOUNTER — Ambulatory Visit
Admission: RE | Admit: 2022-03-26 | Discharge: 2022-03-26 | Disposition: A | Discharge status: Home / Self Care | Payer: Federal, State, Local not specified - PPO | Source: Ambulatory Visit | Attending: Internal Medicine | Admitting: Internal Medicine

## 2022-03-26 DIAGNOSIS — Z87891 Personal history of nicotine dependence: Secondary | ICD-10-CM

## 2022-03-26 DIAGNOSIS — J439 Emphysema, unspecified: Secondary | ICD-10-CM | POA: Diagnosis not present

## 2022-03-26 DIAGNOSIS — R911 Solitary pulmonary nodule: Secondary | ICD-10-CM

## 2022-03-26 DIAGNOSIS — I700 Atherosclerosis of aorta: Secondary | ICD-10-CM | POA: Diagnosis not present

## 2022-03-28 ENCOUNTER — Other Ambulatory Visit: Payer: Self-pay

## 2022-03-28 DIAGNOSIS — F17210 Nicotine dependence, cigarettes, uncomplicated: Secondary | ICD-10-CM

## 2022-03-28 DIAGNOSIS — Z87891 Personal history of nicotine dependence: Secondary | ICD-10-CM

## 2022-04-06 DIAGNOSIS — K862 Cyst of pancreas: Secondary | ICD-10-CM | POA: Diagnosis not present

## 2022-04-06 DIAGNOSIS — R310 Gross hematuria: Secondary | ICD-10-CM | POA: Diagnosis not present

## 2022-04-06 DIAGNOSIS — D269 Other benign neoplasm of uterus, unspecified: Secondary | ICD-10-CM | POA: Diagnosis not present

## 2022-04-06 DIAGNOSIS — N200 Calculus of kidney: Secondary | ICD-10-CM | POA: Diagnosis not present

## 2022-04-22 ENCOUNTER — Other Ambulatory Visit: Payer: Self-pay | Admitting: Internal Medicine

## 2022-05-03 ENCOUNTER — Other Ambulatory Visit: Payer: Medicare Other

## 2022-05-17 DIAGNOSIS — D1801 Hemangioma of skin and subcutaneous tissue: Secondary | ICD-10-CM | POA: Diagnosis not present

## 2022-05-17 DIAGNOSIS — D225 Melanocytic nevi of trunk: Secondary | ICD-10-CM | POA: Diagnosis not present

## 2022-05-17 DIAGNOSIS — L821 Other seborrheic keratosis: Secondary | ICD-10-CM | POA: Diagnosis not present

## 2022-05-17 DIAGNOSIS — L814 Other melanin hyperpigmentation: Secondary | ICD-10-CM | POA: Diagnosis not present

## 2022-05-17 DIAGNOSIS — L509 Urticaria, unspecified: Secondary | ICD-10-CM | POA: Diagnosis not present

## 2022-11-03 DIAGNOSIS — Z23 Encounter for immunization: Secondary | ICD-10-CM | POA: Diagnosis not present

## 2022-11-28 DIAGNOSIS — J449 Chronic obstructive pulmonary disease, unspecified: Secondary | ICD-10-CM | POA: Diagnosis not present

## 2022-11-28 DIAGNOSIS — Z0000 Encounter for general adult medical examination without abnormal findings: Secondary | ICD-10-CM | POA: Diagnosis not present

## 2022-11-28 DIAGNOSIS — K219 Gastro-esophageal reflux disease without esophagitis: Secondary | ICD-10-CM | POA: Diagnosis not present

## 2022-11-28 DIAGNOSIS — F322 Major depressive disorder, single episode, severe without psychotic features: Secondary | ICD-10-CM | POA: Diagnosis not present

## 2022-11-28 DIAGNOSIS — E559 Vitamin D deficiency, unspecified: Secondary | ICD-10-CM | POA: Diagnosis not present

## 2022-11-28 DIAGNOSIS — G4700 Insomnia, unspecified: Secondary | ICD-10-CM | POA: Diagnosis not present

## 2022-11-28 DIAGNOSIS — F509 Eating disorder, unspecified: Secondary | ICD-10-CM | POA: Diagnosis not present

## 2022-11-28 DIAGNOSIS — Z23 Encounter for immunization: Secondary | ICD-10-CM | POA: Diagnosis not present

## 2022-11-28 DIAGNOSIS — I77810 Thoracic aortic ectasia: Secondary | ICD-10-CM | POA: Diagnosis not present

## 2022-11-28 DIAGNOSIS — E538 Deficiency of other specified B group vitamins: Secondary | ICD-10-CM | POA: Diagnosis not present

## 2022-11-28 DIAGNOSIS — Z1331 Encounter for screening for depression: Secondary | ICD-10-CM | POA: Diagnosis not present

## 2022-11-28 DIAGNOSIS — G8929 Other chronic pain: Secondary | ICD-10-CM | POA: Diagnosis not present

## 2022-11-28 DIAGNOSIS — Z5181 Encounter for therapeutic drug level monitoring: Secondary | ICD-10-CM | POA: Diagnosis not present

## 2022-11-28 DIAGNOSIS — M25561 Pain in right knee: Secondary | ICD-10-CM | POA: Diagnosis not present

## 2022-11-28 DIAGNOSIS — Z13820 Encounter for screening for osteoporosis: Secondary | ICD-10-CM | POA: Diagnosis not present

## 2022-11-29 DIAGNOSIS — H9113 Presbycusis, bilateral: Secondary | ICD-10-CM | POA: Diagnosis not present

## 2022-11-29 DIAGNOSIS — H903 Sensorineural hearing loss, bilateral: Secondary | ICD-10-CM | POA: Diagnosis not present

## 2022-12-26 DIAGNOSIS — N289 Disorder of kidney and ureter, unspecified: Secondary | ICD-10-CM | POA: Diagnosis not present

## 2023-02-13 ENCOUNTER — Other Ambulatory Visit: Payer: Self-pay

## 2023-02-13 DIAGNOSIS — N951 Menopausal and female climacteric states: Secondary | ICD-10-CM | POA: Diagnosis not present

## 2023-02-13 DIAGNOSIS — F419 Anxiety disorder, unspecified: Secondary | ICD-10-CM | POA: Diagnosis not present

## 2023-02-13 DIAGNOSIS — Z122 Encounter for screening for malignant neoplasm of respiratory organs: Secondary | ICD-10-CM

## 2023-02-13 DIAGNOSIS — Z6822 Body mass index (BMI) 22.0-22.9, adult: Secondary | ICD-10-CM | POA: Diagnosis not present

## 2023-02-13 DIAGNOSIS — Z1231 Encounter for screening mammogram for malignant neoplasm of breast: Secondary | ICD-10-CM | POA: Diagnosis not present

## 2023-02-13 DIAGNOSIS — Z87891 Personal history of nicotine dependence: Secondary | ICD-10-CM

## 2023-02-13 DIAGNOSIS — F17210 Nicotine dependence, cigarettes, uncomplicated: Secondary | ICD-10-CM

## 2023-02-13 DIAGNOSIS — Z124 Encounter for screening for malignant neoplasm of cervix: Secondary | ICD-10-CM | POA: Diagnosis not present

## 2023-03-12 DIAGNOSIS — R748 Abnormal levels of other serum enzymes: Secondary | ICD-10-CM | POA: Diagnosis not present

## 2023-03-29 ENCOUNTER — Ambulatory Visit
Admission: RE | Admit: 2023-03-29 | Discharge: 2023-03-29 | Disposition: A | Discharge status: Home / Self Care | Payer: Medicare Other | Source: Ambulatory Visit | Attending: Pediatric Surgery | Admitting: Pediatric Surgery

## 2023-03-29 DIAGNOSIS — F17210 Nicotine dependence, cigarettes, uncomplicated: Secondary | ICD-10-CM

## 2023-03-29 DIAGNOSIS — Z87891 Personal history of nicotine dependence: Secondary | ICD-10-CM

## 2023-03-29 DIAGNOSIS — Z122 Encounter for screening for malignant neoplasm of respiratory organs: Secondary | ICD-10-CM

## 2023-04-18 ENCOUNTER — Telehealth: Payer: Self-pay

## 2023-04-18 NOTE — Telephone Encounter (Signed)
 Copied from CRM [id number]. Topic: Appointments - Appointment Cancel/Reschedule >> Apr 17, 2023  9:21 AM Nicole Mercer wrote:     Patient had low dose lung cancer screening done 3/14 and has not heard back any results as of yet.  No notes from dr    Sherron Monday w/ pt that result are not in at this time and someone will call her as soon as they are available

## 2023-04-25 ENCOUNTER — Other Ambulatory Visit: Payer: Self-pay

## 2023-04-25 DIAGNOSIS — Z87891 Personal history of nicotine dependence: Secondary | ICD-10-CM

## 2023-04-25 DIAGNOSIS — Z122 Encounter for screening for malignant neoplasm of respiratory organs: Secondary | ICD-10-CM

## 2023-04-25 DIAGNOSIS — F17210 Nicotine dependence, cigarettes, uncomplicated: Secondary | ICD-10-CM

## 2023-05-06 ENCOUNTER — Other Ambulatory Visit: Payer: Self-pay | Admitting: Internal Medicine

## 2023-05-15 DIAGNOSIS — J449 Chronic obstructive pulmonary disease, unspecified: Secondary | ICD-10-CM | POA: Diagnosis not present

## 2023-05-15 DIAGNOSIS — F322 Major depressive disorder, single episode, severe without psychotic features: Secondary | ICD-10-CM | POA: Diagnosis not present

## 2023-05-22 DIAGNOSIS — G44211 Episodic tension-type headache, intractable: Secondary | ICD-10-CM | POA: Diagnosis not present

## 2023-05-22 DIAGNOSIS — J069 Acute upper respiratory infection, unspecified: Secondary | ICD-10-CM | POA: Diagnosis not present

## 2023-05-22 DIAGNOSIS — R062 Wheezing: Secondary | ICD-10-CM | POA: Diagnosis not present

## 2023-05-22 DIAGNOSIS — R051 Acute cough: Secondary | ICD-10-CM | POA: Diagnosis not present

## 2023-05-22 DIAGNOSIS — Z20822 Contact with and (suspected) exposure to covid-19: Secondary | ICD-10-CM | POA: Diagnosis not present

## 2023-06-15 DIAGNOSIS — J449 Chronic obstructive pulmonary disease, unspecified: Secondary | ICD-10-CM | POA: Diagnosis not present

## 2023-06-15 DIAGNOSIS — F322 Major depressive disorder, single episode, severe without psychotic features: Secondary | ICD-10-CM | POA: Diagnosis not present

## 2023-06-23 ENCOUNTER — Other Ambulatory Visit: Payer: Self-pay | Admitting: Internal Medicine

## 2023-07-08 ENCOUNTER — Ambulatory Visit: Admitting: Physician Assistant

## 2023-07-25 ENCOUNTER — Ambulatory Visit: Admitting: Physician Assistant

## 2023-07-25 DIAGNOSIS — L821 Other seborrheic keratosis: Secondary | ICD-10-CM | POA: Diagnosis not present

## 2023-07-25 DIAGNOSIS — L814 Other melanin hyperpigmentation: Secondary | ICD-10-CM | POA: Diagnosis not present

## 2023-07-25 DIAGNOSIS — D1801 Hemangioma of skin and subcutaneous tissue: Secondary | ICD-10-CM | POA: Diagnosis not present

## 2023-07-26 ENCOUNTER — Other Ambulatory Visit: Payer: Self-pay | Admitting: Internal Medicine

## 2023-07-26 ENCOUNTER — Ambulatory Visit: Admitting: Physician Assistant

## 2023-08-02 DIAGNOSIS — H43813 Vitreous degeneration, bilateral: Secondary | ICD-10-CM | POA: Diagnosis not present

## 2023-08-15 DIAGNOSIS — J449 Chronic obstructive pulmonary disease, unspecified: Secondary | ICD-10-CM | POA: Diagnosis not present

## 2023-08-15 DIAGNOSIS — F322 Major depressive disorder, single episode, severe without psychotic features: Secondary | ICD-10-CM | POA: Diagnosis not present

## 2023-09-06 ENCOUNTER — Ambulatory Visit: Admitting: Physician Assistant

## 2023-09-10 ENCOUNTER — Ambulatory Visit (INDEPENDENT_AMBULATORY_CARE_PROVIDER_SITE_OTHER): Admitting: Physician Assistant

## 2023-09-10 ENCOUNTER — Encounter: Payer: Self-pay | Admitting: Physician Assistant

## 2023-09-10 VITALS — BP 122/70 | HR 68 | Ht 66.0 in | Wt 139.0 lb

## 2023-09-10 DIAGNOSIS — K219 Gastro-esophageal reflux disease without esophagitis: Secondary | ICD-10-CM | POA: Diagnosis not present

## 2023-09-10 DIAGNOSIS — R1013 Epigastric pain: Secondary | ICD-10-CM

## 2023-09-10 MED ORDER — PANTOPRAZOLE SODIUM 40 MG PO TBEC: 40.0000 mg | DELAYED_RELEASE_TABLET | Freq: Every day | ORAL | 3 refills | Status: AC

## 2023-09-10 NOTE — Patient Instructions (Signed)
 We have sent the following medications to your pharmacy for you to pick up at your convenience: Pantoprazole    Please purchase the following medications over the counter and take as directed: Pepcid 20 mg as needed for breakthrough.   _______________________________________________________  If your blood pressure at your visit was 140/90 or greater, please contact your primary care physician to follow up on this.  _______________________________________________________  If you are age 1 or older, your body mass index should be between 23-30. Your Body mass index is 22.44 kg/m. If this is out of the aforementioned range listed, please consider follow up with your Primary Care Provider.  If you are age 78 or younger, your body mass index should be between 19-25. Your Body mass index is 22.44 kg/m. If this is out of the aformentioned range listed, please consider follow up with your Primary Care Provider.   ________________________________________________________  The [facility] providers would like to encourage you to use MYCHART to communicate with providers for non-urgent requests or questions.  Due to long hold times on the telephone, sending your provider a message by [facility] may be a faster and more efficient way to get a response.  Please allow 48 business hours for a response.  Please remember that this is for non-urgent requests.  _______________________________________________________  [facility] is using a team-based approach to care.  Your team is made up of your doctor and two to three APPS. Our APPS (Nurse Practitioners and Physician Assistants) work with your physician to ensure care continuity for you. They are fully qualified to address your health concerns and develop a treatment plan. They communicate directly with your gastroenterologist to care for you. Seeing the Advanced Practice Practitioners on your physician's team can help you by facilitating care more  promptly, often allowing for earlier appointments, access to diagnostic testing, procedures, and other specialty referrals.   Thank you for choosing me and [facility].  Delon Failing, PA-C

## 2023-09-10 NOTE — Progress Notes (Signed)
 Chief Complaint: Follow up GERD  HPI:    Nicole Mercer is a 78 year old female with a past medical history as listed below including reflux/dyspepsia and globus/dysphagia sensation, known to Dr. Avram, who presents to clinic today for refills of her Pantoprazole  and discussion of her reflux.    06/17/2017 EGD with nonbleeding erosive gastropathy and otherwise normal.  Dilation performed the entire esophagus.    02/25/2018 colonoscopy with external hemorrhoids and diverticulosis in the sigmoid colon.  No repeat recommended.    11/11/2020 patient seen in clinic in that time doing well on Pantoprazole  40 mg daily.  At time Long discussion had about PPIs and there was obvious clinical benefit from staying on Pantoprazole  40 daily so she was told to do so.    Today, the patient presents to clinic and explains that she is doing well on Pantoprazole  40 mg daily.  Does have very infrequent episodes where she gets a sensation of having trouble swallowing, sometimes occurs after drinking water and then she will develop some epigastric pain and burning, this all lasts less than an hour.  Not sure if it is related to what she is eating drinking or stress levels. Has never really paid attention because it only happens once every 2 to 3 months.  She does not usually do anything for these episodes just waits for them to pass.  They really do not bother her.  Does not want further workup.  Tells me that in general she just eats less than she used to.    Denies fever, chills or weight loss.   Past Medical History:  Diagnosis Date   Allergy    Anxiety    Cataract    Diverticulosis    Esophageal spasm    GERD (gastroesophageal reflux disease)    Helicobacter pylori gastritis    Osteoporosis    Urticaria     Past Surgical History:  Procedure Laterality Date   COLONOSCOPY     ESOPHAGOGASTRODUODENOSCOPY     ROTATOR CUFF REPAIR  2021   SIGMOIDOSCOPY     UPPER GASTROINTESTINAL ENDOSCOPY      Current  Outpatient Medications  Medication Sig Dispense Refill   CALCIUM PO Take by mouth 2 (two) times daily.     clonazePAM (KLONOPIN) 1 MG tablet Takes one tablet by mouth at bedtime     estradiol (ESTRACE) 1 MG tablet Takes one tablet by mouth once daily     hydrOXYzine (ATARAX/VISTARIL) 25 MG tablet Take 25 mg by mouth at bedtime.     Multiple Vitamins-Minerals (MULTIVITAMIN ADULT PO) Take by mouth.     pantoprazole  (PROTONIX ) 40 MG tablet TAKE 1 TABLET BY MOUTH EVERY DAY BEFORE BREAKFAST 90 tablet 0   progesterone (PROMETRIUM) 200 MG capsule Take by mouth at bedtime. Every 3 months     sertraline (ZOLOFT) 100 MG tablet Takes one tablet by mouth once daily     sucralfate  (CARAFATE ) 1 g tablet TAKE 1 TABLET (1 G TOTAL) BY MOUTH 4 (FOUR) TIMES DAILY - WITH MEALS AND AT BEDTIME. 360 tablet 3   topiramate (TOPAMAX) 25 MG capsule Take 25 mg by mouth as needed.     HYDROcodone -acetaminophen  (NORCO) 10-325 MG tablet Take 1 tablet by mouth every 6 (six) hours as needed. (Patient not taking: Reported on 09/10/2023) 30 tablet 0   levocetirizine (XYZAL) 5 MG tablet Take 5 mg by mouth every evening. (Patient not taking: Reported on 09/10/2023)     No current facility-administered medications for this visit.  Allergies as of 09/10/2023 - Review Complete 09/10/2023  Allergen Reaction Noted   Epinephrine  06/17/2017    Family History  Problem Relation Age of Onset   Colon polyps Mother    Diabetes Mother    Prostate cancer Father    Diabetes Sister    Brain cancer Sister    Colon cancer Neg Hx    Esophageal cancer Neg Hx    Rectal cancer Neg Hx    Stomach cancer Neg Hx     Social History   Socioeconomic History   Marital status: Divorced    Spouse name: Not on file   Number of children: Not on file   Years of education: Not on file   Highest education level: Not on file  Occupational History   Occupation: Retired    Associate Professor: [employer]    Comment: Retired   Occupation: Airline pilot     Comment: Part-time at [employer]  Tobacco Use   Smoking status: Every Day    Current packs/day: 1.75    Average packs/day: 1.8 packs/day for 30.0 years (52.5 ttl pk-yrs)    Types: Cigarettes   Smokeless tobacco: Never   Tobacco comments:    quit 2 months ago  Vaping Use   Vaping status: Never Used  Substance and Sexual Activity   Alcohol use: Yes    Alcohol/week: 2.0 standard drinks of alcohol    Types: 1 Glasses of wine, 1 Shots of liquor per week    Comment: Couple of times a week, moderate alcohol   Drug use: No   Sexual activity: Not on file  Other Topics Concern   Not on file  Social History Narrative   She is retired from [employer], she works part-time at the [employer].  2 sons one daughter   Divorced   Daily caffeine    Social Drivers of Corporate investment banker Strain: Not on file  Food Insecurity: Not on file  Transportation Needs: Not on file  Physical Activity: Not on file  Stress: Not on file  Social Connections: Not on file  Intimate Partner Violence: Not on file    Review of Systems:    Constitutional: No weight loss, fever or chills Skin: No rash  Cardiovascular: No chest pain Respiratory: No SOB or cough Gastrointestinal: See HPI and otherwise negative Genitourinary: No dysuria Neurological: No headache, dizziness or syncope Musculoskeletal: No new muscle or joint pain Hematologic: No bleeding  Psychiatric: No history of depression or anxiety   Physical Exam:  Vital signs: BP 122/70   Pulse 68   Ht 5' 6 (1.676 m)   Wt 139 lb (63 kg)   BMI 22.44 kg/m    Constitutional:   Pleasant Caucasian female appears to be in NAD, Well developed, Well nourished, alert and cooperative Head:  Normocephalic and atraumatic. Eyes:   PEERL, EOMI. No icterus. Conjunctiva pink. Ears:  Normal auditory acuity. Neck:  Supple Throat: Oral cavity and pharynx without inflammation, swelling or lesion.  Respiratory: Respirations even  and unlabored. Lungs clear to auscultation bilaterally.   No wheezes, crackles, or rhonchi.  Cardiovascular: Normal S1, S2. No MRG. Regular rate and rhythm. No peripheral edema, cyanosis or pallor.  Gastrointestinal:  Soft, nondistended, mild epigastric ttp. No rebound or guarding. Normal bowel sounds. No appreciable masses or hepatomegaly. Rectal:  Not performed.  Msk:  Symmetrical without gross deformities. Without edema, no deformity or joint abnormality.  Neurologic:  Alert and  oriented x4;  grossly  normal neurologically.  Skin:   Dry and intact without significant lesions or rashes. Psychiatric: Demonstrates good judgement and reason without abnormal affect or behaviors.  No relevant labs or imaging.  Assessment: 1.  GERD: Last EGD in 2019 with erosive gastritis, patient has benefited from Pantoprazole  40 mg daily long-term, does have occasional episodes once every 2 to 3 months of what sounds like an esophageal spasm, I can see history of this in her chart though it looks like it was documented going all the way back to 2009 but no esophageal manometry has been done, patient is not interested in further workup for this disease, episodes do not last long and do not occur often; known erosive gastritis  Plan: 1.  Continue Pantoprazole  40 mg daily, prescribed #90 with 3 refills.  This can be refilled for another year by us  without the patient being seen. 2.  Discussed what sounds like a possible esophageal spasm occasionally, she has had her esophagus empirically dilated the last in 2019 and also has a diagnosis of esophageal spasm in her chart though I cannot find documentation of a manometry.  She is not interested in further workup for this.  The symptoms do not happen often and she cannot control them and they go away on their own.  Did recommend that she trial an over-the-counter Pepcid 20 mg during these episodes to see if it helps. 3.  Patient to follow in clinic in 2 years if doing well  for further refills.  Nicole Failing, PA-C [facility] 09/10/2023, 10:38 AM  Cc: No ref. provider found

## 2023-09-15 DIAGNOSIS — F322 Major depressive disorder, single episode, severe without psychotic features: Secondary | ICD-10-CM | POA: Diagnosis not present

## 2023-09-15 DIAGNOSIS — J449 Chronic obstructive pulmonary disease, unspecified: Secondary | ICD-10-CM | POA: Diagnosis not present

## 2023-10-09 DIAGNOSIS — Z23 Encounter for immunization: Secondary | ICD-10-CM | POA: Diagnosis not present

## 2023-10-15 DIAGNOSIS — J449 Chronic obstructive pulmonary disease, unspecified: Secondary | ICD-10-CM | POA: Diagnosis not present

## 2023-10-15 DIAGNOSIS — F322 Major depressive disorder, single episode, severe without psychotic features: Secondary | ICD-10-CM | POA: Diagnosis not present

## 2023-11-15 DIAGNOSIS — F322 Major depressive disorder, single episode, severe without psychotic features: Secondary | ICD-10-CM | POA: Diagnosis not present

## 2023-11-15 DIAGNOSIS — J449 Chronic obstructive pulmonary disease, unspecified: Secondary | ICD-10-CM | POA: Diagnosis not present

## 2023-12-04 DIAGNOSIS — F509 Eating disorder, unspecified: Secondary | ICD-10-CM | POA: Diagnosis not present

## 2023-12-04 DIAGNOSIS — E559 Vitamin D deficiency, unspecified: Secondary | ICD-10-CM | POA: Diagnosis not present

## 2023-12-04 DIAGNOSIS — J449 Chronic obstructive pulmonary disease, unspecified: Secondary | ICD-10-CM | POA: Diagnosis not present

## 2023-12-04 DIAGNOSIS — Z5181 Encounter for therapeutic drug level monitoring: Secondary | ICD-10-CM | POA: Diagnosis not present

## 2023-12-04 DIAGNOSIS — N1831 Chronic kidney disease, stage 3a: Secondary | ICD-10-CM | POA: Diagnosis not present

## 2023-12-04 DIAGNOSIS — G4700 Insomnia, unspecified: Secondary | ICD-10-CM | POA: Diagnosis not present

## 2023-12-04 DIAGNOSIS — Z23 Encounter for immunization: Secondary | ICD-10-CM | POA: Diagnosis not present

## 2023-12-04 DIAGNOSIS — E538 Deficiency of other specified B group vitamins: Secondary | ICD-10-CM | POA: Diagnosis not present

## 2023-12-04 DIAGNOSIS — K219 Gastro-esophageal reflux disease without esophagitis: Secondary | ICD-10-CM | POA: Diagnosis not present

## 2023-12-04 DIAGNOSIS — I77810 Thoracic aortic ectasia: Secondary | ICD-10-CM | POA: Diagnosis not present

## 2023-12-04 DIAGNOSIS — F322 Major depressive disorder, single episode, severe without psychotic features: Secondary | ICD-10-CM | POA: Diagnosis not present

## 2023-12-04 DIAGNOSIS — G8929 Other chronic pain: Secondary | ICD-10-CM | POA: Diagnosis not present

## 2023-12-04 DIAGNOSIS — N289 Disorder of kidney and ureter, unspecified: Secondary | ICD-10-CM | POA: Diagnosis not present

## 2023-12-15 DIAGNOSIS — F322 Major depressive disorder, single episode, severe without psychotic features: Secondary | ICD-10-CM | POA: Diagnosis not present

## 2023-12-15 DIAGNOSIS — J449 Chronic obstructive pulmonary disease, unspecified: Secondary | ICD-10-CM | POA: Diagnosis not present
